# Patient Record
Sex: Female | Born: 1945 | Hispanic: Yes | Marital: Married | Smoking: Never smoker
Health system: Southern US, Community
[De-identification: ages and names within clinical notes are randomized; demographics above are authoritative.]

## PROBLEM LIST (undated history)

## (undated) DIAGNOSIS — I10 Essential (primary) hypertension: Secondary | ICD-10-CM

## (undated) DIAGNOSIS — R55 Syncope and collapse: Secondary | ICD-10-CM

## (undated) DIAGNOSIS — R42 Dizziness and giddiness: Secondary | ICD-10-CM

## (undated) DIAGNOSIS — R609 Edema, unspecified: Secondary | ICD-10-CM

## (undated) HISTORY — PX: EYE SURGERY: SHX253

---

## 2004-09-23 ENCOUNTER — Ambulatory Visit: Payer: Self-pay

## 2004-10-01 ENCOUNTER — Ambulatory Visit: Payer: Self-pay

## 2005-04-06 ENCOUNTER — Ambulatory Visit: Payer: Self-pay

## 2006-07-18 ENCOUNTER — Ambulatory Visit: Payer: Self-pay

## 2006-10-11 ENCOUNTER — Ambulatory Visit: Payer: Self-pay

## 2006-10-23 ENCOUNTER — Ambulatory Visit: Payer: Self-pay

## 2007-11-28 ENCOUNTER — Ambulatory Visit: Payer: Self-pay

## 2014-06-06 ENCOUNTER — Other Ambulatory Visit: Payer: Self-pay | Admitting: Primary Care

## 2014-06-06 DIAGNOSIS — Z1231 Encounter for screening mammogram for malignant neoplasm of breast: Secondary | ICD-10-CM

## 2014-06-18 ENCOUNTER — Other Ambulatory Visit: Payer: Self-pay | Admitting: Primary Care

## 2014-06-18 ENCOUNTER — Ambulatory Visit
Admission: RE | Admit: 2014-06-18 | Discharge: 2014-06-18 | Disposition: A | Discharge status: Home / Self Care | Payer: Medicare Other | Source: Ambulatory Visit | Attending: Primary Care | Admitting: Primary Care

## 2014-06-18 DIAGNOSIS — Z1231 Encounter for screening mammogram for malignant neoplasm of breast: Secondary | ICD-10-CM | POA: Diagnosis present

## 2014-07-16 ENCOUNTER — Inpatient Hospital Stay: Admission: RE | Admit: 2014-07-16 | Payer: Medicare Other | Source: Ambulatory Visit

## 2014-07-17 ENCOUNTER — Encounter
Admission: RE | Admit: 2014-07-17 | Discharge: 2014-07-17 | Disposition: A | Discharge status: Home / Self Care | Payer: Medicare Other | Source: Ambulatory Visit | Attending: Ophthalmology | Admitting: Ophthalmology

## 2014-07-17 DIAGNOSIS — R2243 Localized swelling, mass and lump, lower limb, bilateral: Secondary | ICD-10-CM | POA: Insufficient documentation

## 2014-07-17 DIAGNOSIS — Z01810 Encounter for preprocedural cardiovascular examination: Secondary | ICD-10-CM | POA: Diagnosis present

## 2014-07-17 DIAGNOSIS — Z01812 Encounter for preprocedural laboratory examination: Secondary | ICD-10-CM | POA: Diagnosis present

## 2014-07-17 DIAGNOSIS — R55 Syncope and collapse: Secondary | ICD-10-CM | POA: Diagnosis not present

## 2014-07-17 DIAGNOSIS — I10 Essential (primary) hypertension: Secondary | ICD-10-CM | POA: Insufficient documentation

## 2014-07-17 DIAGNOSIS — E780 Pure hypercholesterolemia: Secondary | ICD-10-CM | POA: Insufficient documentation

## 2014-07-17 DIAGNOSIS — H2511 Age-related nuclear cataract, right eye: Secondary | ICD-10-CM | POA: Insufficient documentation

## 2014-07-17 DIAGNOSIS — R42 Dizziness and giddiness: Secondary | ICD-10-CM | POA: Insufficient documentation

## 2014-07-17 LAB — POTASSIUM: Potassium: 4.9 mmol/L (ref 3.5–5.1)

## 2014-07-18 ENCOUNTER — Encounter: Payer: Self-pay | Admitting: *Deleted

## 2014-07-18 DIAGNOSIS — R42 Dizziness and giddiness: Secondary | ICD-10-CM | POA: Diagnosis not present

## 2014-07-18 DIAGNOSIS — H2511 Age-related nuclear cataract, right eye: Secondary | ICD-10-CM | POA: Diagnosis present

## 2014-07-18 DIAGNOSIS — I10 Essential (primary) hypertension: Secondary | ICD-10-CM | POA: Diagnosis not present

## 2014-07-18 DIAGNOSIS — E669 Obesity, unspecified: Secondary | ICD-10-CM | POA: Diagnosis not present

## 2014-07-18 DIAGNOSIS — Z79899 Other long term (current) drug therapy: Secondary | ICD-10-CM | POA: Diagnosis not present

## 2014-07-18 DIAGNOSIS — M7989 Other specified soft tissue disorders: Secondary | ICD-10-CM | POA: Diagnosis not present

## 2014-07-18 DIAGNOSIS — E780 Pure hypercholesterolemia: Secondary | ICD-10-CM | POA: Diagnosis not present

## 2014-07-18 DIAGNOSIS — R55 Syncope and collapse: Secondary | ICD-10-CM | POA: Diagnosis not present

## 2014-07-18 DIAGNOSIS — Z87891 Personal history of nicotine dependence: Secondary | ICD-10-CM | POA: Diagnosis not present

## 2014-07-22 ENCOUNTER — Encounter
Admission: RE | Disposition: A | Discharge status: Home / Self Care | Payer: Self-pay | Source: Ambulatory Visit | Attending: Ophthalmology

## 2014-07-22 ENCOUNTER — Ambulatory Visit: Payer: Medicare Other | Admitting: Anesthesiology

## 2014-07-22 ENCOUNTER — Ambulatory Visit
Admission: RE | Admit: 2014-07-22 | Discharge: 2014-07-22 | Disposition: A | Discharge status: Home / Self Care | Payer: Medicare Other | Source: Ambulatory Visit | Attending: Ophthalmology | Admitting: Ophthalmology

## 2014-07-22 ENCOUNTER — Encounter: Payer: Self-pay | Admitting: Ophthalmology

## 2014-07-22 DIAGNOSIS — Z79899 Other long term (current) drug therapy: Secondary | ICD-10-CM | POA: Insufficient documentation

## 2014-07-22 DIAGNOSIS — H2511 Age-related nuclear cataract, right eye: Secondary | ICD-10-CM | POA: Insufficient documentation

## 2014-07-22 DIAGNOSIS — I10 Essential (primary) hypertension: Secondary | ICD-10-CM | POA: Insufficient documentation

## 2014-07-22 DIAGNOSIS — E669 Obesity, unspecified: Secondary | ICD-10-CM | POA: Insufficient documentation

## 2014-07-22 DIAGNOSIS — E780 Pure hypercholesterolemia: Secondary | ICD-10-CM | POA: Insufficient documentation

## 2014-07-22 DIAGNOSIS — R55 Syncope and collapse: Secondary | ICD-10-CM | POA: Insufficient documentation

## 2014-07-22 DIAGNOSIS — R42 Dizziness and giddiness: Secondary | ICD-10-CM | POA: Insufficient documentation

## 2014-07-22 DIAGNOSIS — Z87891 Personal history of nicotine dependence: Secondary | ICD-10-CM | POA: Insufficient documentation

## 2014-07-22 DIAGNOSIS — M7989 Other specified soft tissue disorders: Secondary | ICD-10-CM | POA: Insufficient documentation

## 2014-07-22 HISTORY — DX: Essential (primary) hypertension: I10

## 2014-07-22 HISTORY — DX: Syncope and collapse: R55

## 2014-07-22 HISTORY — DX: Edema, unspecified: R60.9

## 2014-07-22 HISTORY — PX: CATARACT EXTRACTION W/PHACO: SHX586

## 2014-07-22 HISTORY — DX: Dizziness and giddiness: R42

## 2014-07-22 SURGERY — PHACOEMULSIFICATION, CATARACT, WITH IOL INSERTION
Anesthesia: Monitor Anesthesia Care | Site: Eye | Laterality: Right | Wound class: Clean

## 2014-07-22 MED ORDER — MOXIFLOXACIN HCL 0.5 % OP SOLN
OPHTHALMIC | Status: DC | PRN
  Administered 2014-07-22: 1 [drp] via OPHTHALMIC

## 2014-07-22 MED ORDER — POVIDONE-IODINE 5 % OP SOLN
OPHTHALMIC | Status: AC
  Administered 2014-07-22: 1 via OPHTHALMIC
  Filled 2014-07-22: qty 30

## 2014-07-22 MED ORDER — CEFUROXIME OPHTHALMIC INJECTION 1 MG/0.1 ML
INJECTION | OPHTHALMIC | Status: DC | PRN
  Administered 2014-07-22: 0.1 mL via INTRACAMERAL

## 2014-07-22 MED ORDER — TETRACAINE HCL 0.5 % OP SOLN
OPHTHALMIC | Status: AC
  Administered 2014-07-22: 1 [drp] via OPHTHALMIC
  Filled 2014-07-22: qty 2

## 2014-07-22 MED ORDER — MOXIFLOXACIN HCL 0.5 % OP SOLN
OPHTHALMIC | Status: AC
  Filled 2014-07-22: qty 3

## 2014-07-22 MED ORDER — ARMC OPHTHALMIC DILATING GEL
OPHTHALMIC | Status: AC
  Administered 2014-07-22: 1 via OPHTHALMIC
  Filled 2014-07-22: qty 0.25

## 2014-07-22 MED ORDER — ARMC OPHTHALMIC DILATING GEL
1.0000 "application " | OPHTHALMIC | Status: DC | PRN
  Administered 2014-07-22: 1 via OPHTHALMIC

## 2014-07-22 MED ORDER — TETRACAINE HCL 0.5 % OP SOLN
1.0000 [drp] | OPHTHALMIC | Status: AC | PRN
  Administered 2014-07-22: 1 [drp] via OPHTHALMIC

## 2014-07-22 MED ORDER — BSS IO SOLN
INTRAOCULAR | Status: DC | PRN
  Administered 2014-07-22: 200 mL

## 2014-07-22 MED ORDER — POVIDONE-IODINE 5 % OP SOLN
1.0000 "application " | OPHTHALMIC | Status: AC | PRN
  Administered 2014-07-22: 1 via OPHTHALMIC

## 2014-07-22 MED ORDER — NA CHONDROIT SULF-NA HYALURON 40-17 MG/ML IO SOLN
INTRAOCULAR | Status: AC
  Filled 2014-07-22: qty 1

## 2014-07-22 MED ORDER — MOXIFLOXACIN HCL 0.5 % OP SOLN: 1.0000 [drp] | Freq: Once | OPHTHALMIC | Status: DC

## 2014-07-22 MED ORDER — EPINEPHRINE HCL 1 MG/ML IJ SOLN
INTRAMUSCULAR | Status: AC
  Filled 2014-07-22: qty 1

## 2014-07-22 MED ORDER — CARBACHOL 0.01 % IO SOLN
INTRAOCULAR | Status: DC | PRN
  Administered 2014-07-22: 0.5 mL via INTRAOCULAR

## 2014-07-22 MED ORDER — SODIUM CHLORIDE 0.9 % IV SOLN
INTRAVENOUS | Status: DC
Start: 2014-07-22 — End: 2014-07-22
  Administered 2014-07-22: 11:00:00 via INTRAVENOUS

## 2014-07-22 MED ORDER — CEFUROXIME OPHTHALMIC INJECTION 1 MG/0.1 ML
INJECTION | OPHTHALMIC | Status: AC
  Filled 2014-07-22: qty 0.1

## 2014-07-22 MED ORDER — MIDAZOLAM HCL 2 MG/2ML IJ SOLN
INTRAMUSCULAR | Status: DC | PRN
  Administered 2014-07-22 (×2): 1 mg via INTRAVENOUS

## 2014-07-22 SURGICAL SUPPLY — 22 items
CANNULA ANT/CHMB 27GA (MISCELLANEOUS) ×2 IMPLANT
GLOVE BIO SURGEON STRL SZ8 (GLOVE) ×2 IMPLANT
GLOVE BIOGEL M 6.5 STRL (GLOVE) ×2 IMPLANT
GLOVE SURG LX 8.0 MICRO (GLOVE) ×1
GLOVE SURG LX STRL 8.0 MICRO (GLOVE) ×1 IMPLANT
GOWN STRL REUS W/ TWL LRG LVL3 (GOWN DISPOSABLE) ×2 IMPLANT
GOWN STRL REUS W/TWL LRG LVL3 (GOWN DISPOSABLE) ×2
LENS IOL TECNIS 24.0 (Intraocular Lens) ×2 IMPLANT
LENS IOL TECNIS MONO 1P 24.0 (Intraocular Lens) ×1 IMPLANT
PACK CATARACT (MISCELLANEOUS) ×2 IMPLANT
PACK CATARACT BRASINGTON LX (MISCELLANEOUS) ×2 IMPLANT
PACK EYE AFTER SURG (MISCELLANEOUS) ×2 IMPLANT
PACK VITRECTOMY CASSETTE 23GA (MISCELLANEOUS) IMPLANT
PACK VITRECTOMY CASSETTE 25GA (MISCELLANEOUS) IMPLANT
SOL BSS BAG (MISCELLANEOUS) ×2
SOL PREP PVP 2OZ (MISCELLANEOUS) ×2
SOLUTION BSS BAG (MISCELLANEOUS) ×1 IMPLANT
SOLUTION PREP PVP 2OZ (MISCELLANEOUS) ×1 IMPLANT
SYR 5ML LL (SYRINGE) ×2 IMPLANT
SYR TB 1ML 27GX1/2 LL (SYRINGE) ×2 IMPLANT
WATER STERILE IRR 1000ML POUR (IV SOLUTION) ×2 IMPLANT
WIPE NON LINTING 3.25X3.25 (MISCELLANEOUS) ×2 IMPLANT

## 2014-07-22 NOTE — Transfer of Care (Signed)
Immediate Anesthesia Transfer of Care Note  Patient: Ana Peterson  Procedure(s) Performed: Procedure(s) with comments: CATARACT EXTRACTION PHACO AND INTRAOCULAR LENS PLACEMENT (IOC) (Right) - US   1:02.7 AP%  25.3 CDE  15.88 fluid pack lot # [id number]  Patient Location: PACU  Anesthesia Type:MAC  Level of Consciousness: awake, alert  and oriented  Airway & Oxygen Therapy: Patient Spontanous Breathing  Post-op Assessment: Report given to RN and Post -op Vital signs reviewed and stable  Post vital signs: stable  Last Vitals:  Filed Vitals:   07/22/14 1224  BP: 139/71  Pulse: 74  Temp: 36.4 C  Resp: 12    Complications: No apparent anesthesia complications

## 2014-07-22 NOTE — Op Note (Signed)
PREOPERATIVE DIAGNOSIS:  Nuclear sclerotic cataract of the right eye.   POSTOPERATIVE DIAGNOSIS: nuclear sclerotic cataract right eye   OPERATIVE PROCEDURE:  Procedure(s): CATARACT EXTRACTION PHACO AND INTRAOCULAR LENS PLACEMENT (IOC)   SURGEON:  Galen ManilaWilliam Susen Haskew, MD.   ANESTHESIA:  Anesthesiologist: Yevette EdwardsJames G Adams, MD CRNA: Irving BurtonJennifer Bachich, CRNA  1.      Managed anesthesia care. 2.      Topical tetracaine drops followed by 2% Xylocaine jelly applied in the preoperative holding area.   COMPLICATIONS:  None.   TECHNIQUE:   Stop and chop   DESCRIPTION OF PROCEDURE:  The patient was examined and consented in the preoperative holding area where the aforementioned topical anesthesia was applied to the right eye and then brought back to the Operating Room where the right eye was prepped and draped in the usual sterile ophthalmic fashion and a lid speculum was placed. A paracentesis was created with the side port blade and the anterior chamber was filled with viscoelastic. A near clear corneal incision was performed with the steel keratome. A continuous curvilinear capsulorrhexis was performed with a cystotome followed by the capsulorrhexis forceps. Hydrodissection and hydrodelineation were carried out with BSS on a blunt cannula. The lens was removed in a stop and chop  technique and the remaining cortical material was removed with the irrigation-aspiration handpiece. The capsular bag was inflated with viscoelastic and the Technis ZCB00  lens was placed in the capsular bag without complication. The remaining viscoelastic was removed from the eye with the irrigation-aspiration handpiece. The wounds were hydrated. The anterior chamber was flushed with Miostat and the eye was inflated to physiologic pressure. 0.1 mL of cefuroxime concentration 10 mg/mL was placed in the anterior chamber. The wounds were found to be water tight. The eye was dressed with Vigamox. The patient was given protective glasses to  wear throughout the day and a shield with which to sleep tonight. The patient was also given drops with which to begin a drop regimen today and will follow-up with me in one day.  Implant Name Type Inv. Item Serial No. Manufacturer Lot No. LRB No. Used  LENS IMPL INTRAOC ZCB00 24.0 - [id number] Intraocular Lens LENS IMPL INTRAOC ZCB00 24.0 [id number] AMO   Right 1   Procedure(s) with comments: CATARACT EXTRACTION PHACO AND INTRAOCULAR LENS PLACEMENT (IOC) (Right) - US   1:02.7 AP%  25.3 CDE  15.88 fluid pack lot # [id number]  Electronically signed: Evelynn Hench LOUIS 07/22/2014 12:23 PM

## 2014-07-22 NOTE — Anesthesia Postprocedure Evaluation (Signed)
  Anesthesia Post-op Note  Patient: Ana Peterson  Procedure(s) Performed: Procedure(s) with comments: CATARACT EXTRACTION PHACO AND INTRAOCULAR LENS PLACEMENT (IOC) (Right) - US   1:02.7 AP%  25.3 CDE  15.88 fluid pack lot # [id number]  Anesthesia type:MAC  Patient location: PACU  Post pain: Pain level controlled  Post assessment: Post-op Vital signs reviewed, Patient's Cardiovascular Status Stable, Respiratory Function Stable, Patent Airway and No signs of Nausea or vomiting  Post vital signs: Reviewed and stable  Last Vitals:  Filed Vitals:   07/22/14 1224  BP: 139/71  Pulse: 74  Temp: 36.4 C  Resp: 12    Level of consciousness: awake, alert  and patient cooperative  Complications: No apparent anesthesia complications

## 2014-07-22 NOTE — H&P (Signed)
  All labs reviewed. Abnormal studies sent to patients PCP when indicated.  Previous H&P reviewed, patient examined, there are NO CHANGES.  Ana Peterson LOUIS6/28/201611:56 AM

## 2014-07-22 NOTE — Discharge Instructions (Addendum)
See cataract post op handout  Eye Surgery Discharge Instructions  Expect mild scratchy sensation or mild soreness. DO NOT RUB YOUR EYE!  The day of surgery:  Minimal physical activity, but bed rest is not required  No reading, computer work, or close hand work  No bending, lifting, or straining.  May watch TV  For 24 hours:  No driving, legal decisions, or alcoholic beverages  Safety precautions  Eat anything you prefer: It is better to start with liquids, then soup then solid foods.  _____ Eye patch should be worn until postoperative exam tomorrow.  ____ Solar shield eyeglasses should be worn for comfort in the sunlight/patch while sleeping  Resume all regular medications including aspirin or Coumadin if these were discontinued prior to surgery. You may shower, bathe, shave, or wash your hair. Tylenol may be taken for mild discomfort.  Call your doctor if you experience significant pain, nausea, or vomiting, fever > 101 or other signs of infection. [phone] or [phone] Specific instructions:  Follow-up Information    Follow up with Carlena BjornstadPORFILIO,WILLIAM LOUIS, MD On 07/23/2014.   Specialty:  Ophthalmology   Why:  9:35   Contact information:   [address] [place] [address] [phone]      Eye Surgery Discharge Instructions  Expect mild scratchy sensation or mild soreness. DO NOT RUB YOUR EYE!  The day of surgery:  Minimal physical activity, but bed rest is not required  No reading, computer work, or close hand work  No bending, lifting, or straining.  May watch TV  For 24 hours:  No driving, legal decisions, or alcoholic beverages  Safety precautions  Eat anything you prefer: It is better to start with liquids, then soup then solid foods.  _____ Eye patch should be worn until postoperative exam tomorrow.  ____ Solar shield eyeglasses should be worn for comfort in the sunlight/patch while sleeping  Resume all regular medications  including aspirin or Coumadin if these were discontinued prior to surgery. You may shower, bathe, shave, or wash your hair. Tylenol may be taken for mild discomfort.  Call your doctor if you experience significant pain, nausea, or vomiting, fever > 101 or other signs of infection. [phone] or [phone] Specific instructions:  Follow-up Information    Follow up with Carlena BjornstadPORFILIO,WILLIAM LOUIS, MD On 07/23/2014.   Specialty:  Ophthalmology   Why:  9:35   Contact information:   [address] [place] [address] [phone]

## 2014-07-22 NOTE — Anesthesia Preprocedure Evaluation (Addendum)
Anesthesia Evaluation  Patient identified by MRN, date of birth, ID band Patient awake    Reviewed: Allergy & Precautions, H&P , NPO status , Patient's Chart, lab work & pertinent test results, reviewed documented beta blocker date and time   Airway Mallampati: II  TM Distance: >3 FB Neck ROM: full    Dental no notable dental hx. (+) Teeth Intact   Pulmonary neg pulmonary ROS,  breath sounds clear to auscultation  Pulmonary exam normal       Cardiovascular Exercise Tolerance: Good hypertension, negative cardio ROS  Rhythm:regular Rate:Normal     Neuro/Psych negative neurological ROS  negative psych ROS   GI/Hepatic negative GI ROS, Neg liver ROS,   Endo/Other  negative endocrine ROS  Renal/GU      Musculoskeletal   Abdominal   Peds  Hematology negative hematology ROS (+)   Anesthesia Other Findings   Reproductive/Obstetrics negative OB ROS                             Anesthesia Physical Anesthesia Plan  ASA: II  Anesthesia Plan: MAC   Post-op Pain Management:    Induction:   Airway Management Planned:   Additional Equipment:   Intra-op Plan:   Post-operative Plan:   Informed Consent: I have reviewed the patients History and Physical, chart, labs and discussed the procedure including the risks, benefits and alternatives for the proposed anesthesia with the patient or authorized representative who has indicated his/her understanding and acceptance.     Plan Discussed with: CRNA  Anesthesia Plan Comments:         Anesthesia Quick Evaluation

## 2015-01-22 ENCOUNTER — Other Ambulatory Visit: Payer: Self-pay | Admitting: Primary Care

## 2015-01-22 DIAGNOSIS — Z0001 Encounter for general adult medical examination with abnormal findings: Secondary | ICD-10-CM

## 2015-02-10 ENCOUNTER — Ambulatory Visit
Admission: RE | Admit: 2015-02-10 | Discharge: 2015-02-10 | Disposition: A | Discharge status: Home / Self Care | Payer: Medicare Other | Source: Ambulatory Visit | Attending: Primary Care | Admitting: Primary Care

## 2015-02-10 DIAGNOSIS — Z13820 Encounter for screening for osteoporosis: Secondary | ICD-10-CM | POA: Insufficient documentation

## 2015-02-10 DIAGNOSIS — M8580 Other specified disorders of bone density and structure, unspecified site: Secondary | ICD-10-CM | POA: Diagnosis not present

## 2015-02-10 DIAGNOSIS — Z0001 Encounter for general adult medical examination with abnormal findings: Secondary | ICD-10-CM

## 2015-07-08 ENCOUNTER — Other Ambulatory Visit: Payer: Self-pay | Admitting: Primary Care

## 2015-07-09 ENCOUNTER — Other Ambulatory Visit: Payer: Self-pay | Admitting: Primary Care

## 2015-07-09 DIAGNOSIS — Z1231 Encounter for screening mammogram for malignant neoplasm of breast: Secondary | ICD-10-CM

## 2015-07-30 ENCOUNTER — Other Ambulatory Visit: Payer: Self-pay | Admitting: Primary Care

## 2015-07-30 ENCOUNTER — Ambulatory Visit
Admission: RE | Admit: 2015-07-30 | Discharge: 2015-07-30 | Disposition: A | Discharge status: Home / Self Care | Payer: Medicare Other | Source: Ambulatory Visit | Attending: Primary Care | Admitting: Primary Care

## 2015-07-30 DIAGNOSIS — Z1231 Encounter for screening mammogram for malignant neoplasm of breast: Secondary | ICD-10-CM | POA: Insufficient documentation

## 2016-07-19 ENCOUNTER — Other Ambulatory Visit: Payer: Self-pay | Admitting: Primary Care

## 2016-07-19 DIAGNOSIS — Z1231 Encounter for screening mammogram for malignant neoplasm of breast: Secondary | ICD-10-CM

## 2016-08-23 ENCOUNTER — Ambulatory Visit
Admission: RE | Admit: 2016-08-23 | Discharge: 2016-08-23 | Disposition: A | Discharge status: Home / Self Care | Payer: Medicare Other | Source: Ambulatory Visit | Attending: Primary Care | Admitting: Primary Care

## 2016-08-23 DIAGNOSIS — Z1231 Encounter for screening mammogram for malignant neoplasm of breast: Secondary | ICD-10-CM | POA: Insufficient documentation

## 2017-08-28 ENCOUNTER — Other Ambulatory Visit: Payer: Self-pay | Admitting: Primary Care

## 2017-08-28 DIAGNOSIS — Z1231 Encounter for screening mammogram for malignant neoplasm of breast: Secondary | ICD-10-CM

## 2017-09-14 ENCOUNTER — Ambulatory Visit
Admission: RE | Admit: 2017-09-14 | Discharge: 2017-09-14 | Disposition: A | Discharge status: Home / Self Care | Payer: Medicare Other | Source: Ambulatory Visit | Attending: Primary Care | Admitting: Primary Care

## 2017-09-14 DIAGNOSIS — Z1231 Encounter for screening mammogram for malignant neoplasm of breast: Secondary | ICD-10-CM | POA: Diagnosis present

## 2017-09-19 ENCOUNTER — Other Ambulatory Visit: Payer: Self-pay | Admitting: Primary Care

## 2017-09-19 DIAGNOSIS — N6489 Other specified disorders of breast: Secondary | ICD-10-CM

## 2017-09-19 DIAGNOSIS — R928 Other abnormal and inconclusive findings on diagnostic imaging of breast: Secondary | ICD-10-CM

## 2017-10-09 ENCOUNTER — Ambulatory Visit
Admission: RE | Admit: 2017-10-09 | Discharge: 2017-10-09 | Disposition: A | Discharge status: Home / Self Care | Payer: Medicare Other | Source: Ambulatory Visit | Attending: Primary Care | Admitting: Primary Care

## 2017-10-09 DIAGNOSIS — N6489 Other specified disorders of breast: Secondary | ICD-10-CM | POA: Diagnosis present

## 2017-10-09 DIAGNOSIS — R928 Other abnormal and inconclusive findings on diagnostic imaging of breast: Secondary | ICD-10-CM

## 2018-01-26 ENCOUNTER — Other Ambulatory Visit: Payer: Self-pay | Admitting: Primary Care

## 2018-01-26 DIAGNOSIS — R1030 Lower abdominal pain, unspecified: Secondary | ICD-10-CM

## 2018-02-01 ENCOUNTER — Ambulatory Visit: Payer: Medicare Other

## 2018-08-23 ENCOUNTER — Other Ambulatory Visit: Payer: Self-pay | Admitting: Primary Care

## 2018-08-23 DIAGNOSIS — Z1231 Encounter for screening mammogram for malignant neoplasm of breast: Secondary | ICD-10-CM

## 2018-08-23 DIAGNOSIS — Z0000 Encounter for general adult medical examination without abnormal findings: Secondary | ICD-10-CM

## 2018-10-10 ENCOUNTER — Ambulatory Visit
Admission: RE | Admit: 2018-10-10 | Discharge: 2018-10-10 | Disposition: A | Discharge status: Home / Self Care | Payer: Medicare Other | Source: Ambulatory Visit | Attending: Primary Care | Admitting: Primary Care

## 2018-10-10 DIAGNOSIS — Z13820 Encounter for screening for osteoporosis: Secondary | ICD-10-CM | POA: Diagnosis not present

## 2018-10-10 DIAGNOSIS — M8580 Other specified disorders of bone density and structure, unspecified site: Secondary | ICD-10-CM | POA: Diagnosis not present

## 2018-10-10 DIAGNOSIS — Z780 Asymptomatic menopausal state: Secondary | ICD-10-CM | POA: Diagnosis not present

## 2018-10-10 DIAGNOSIS — Z1231 Encounter for screening mammogram for malignant neoplasm of breast: Secondary | ICD-10-CM | POA: Insufficient documentation

## 2018-10-10 DIAGNOSIS — Z0000 Encounter for general adult medical examination without abnormal findings: Secondary | ICD-10-CM

## 2018-11-13 ENCOUNTER — Ambulatory Visit: Admission: RE | Admit: 2018-11-13 | Payer: Medicare Other | Source: Ambulatory Visit

## 2018-11-21 ENCOUNTER — Other Ambulatory Visit: Payer: Self-pay | Admitting: Physician Assistant

## 2018-11-21 DIAGNOSIS — R102 Pelvic and perineal pain: Secondary | ICD-10-CM

## 2019-06-17 ENCOUNTER — Ambulatory Visit: Admission: RE | Admit: 2019-06-17 | Payer: Medicare Other | Source: Ambulatory Visit

## 2019-06-19 ENCOUNTER — Other Ambulatory Visit: Payer: Self-pay

## 2019-06-19 ENCOUNTER — Ambulatory Visit
Admission: RE | Admit: 2019-06-19 | Discharge: 2019-06-19 | Disposition: A | Discharge status: Home / Self Care | Payer: Medicare Other | Source: Ambulatory Visit | Attending: Physician Assistant | Admitting: Physician Assistant

## 2019-06-19 DIAGNOSIS — R102 Pelvic and perineal pain: Secondary | ICD-10-CM | POA: Diagnosis present

## 2019-10-30 ENCOUNTER — Other Ambulatory Visit: Payer: Self-pay | Admitting: Primary Care

## 2019-10-30 DIAGNOSIS — Z1231 Encounter for screening mammogram for malignant neoplasm of breast: Secondary | ICD-10-CM

## 2019-12-26 ENCOUNTER — Other Ambulatory Visit: Payer: Self-pay

## 2019-12-26 ENCOUNTER — Ambulatory Visit
Admission: RE | Admit: 2019-12-26 | Discharge: 2019-12-26 | Disposition: A | Discharge status: Home / Self Care | Payer: Medicare Other | Source: Ambulatory Visit | Attending: Primary Care | Admitting: Primary Care

## 2019-12-26 DIAGNOSIS — Z1231 Encounter for screening mammogram for malignant neoplasm of breast: Secondary | ICD-10-CM | POA: Diagnosis present

## 2020-11-20 ENCOUNTER — Other Ambulatory Visit: Payer: Self-pay | Admitting: Primary Care

## 2020-11-20 DIAGNOSIS — Z1231 Encounter for screening mammogram for malignant neoplasm of breast: Secondary | ICD-10-CM

## 2021-03-01 IMAGING — MG DIGITAL SCREENING BILAT W/ TOMO W/ CAD
8 series · 8 of 24 positions shown · non-contrast
Comparison: Previous exam(s).

CLINICAL DATA: Screening.

EXAM:
DIGITAL SCREENING BILATERAL MAMMOGRAM WITH TOMO AND CAD

[L CC synth-2D]
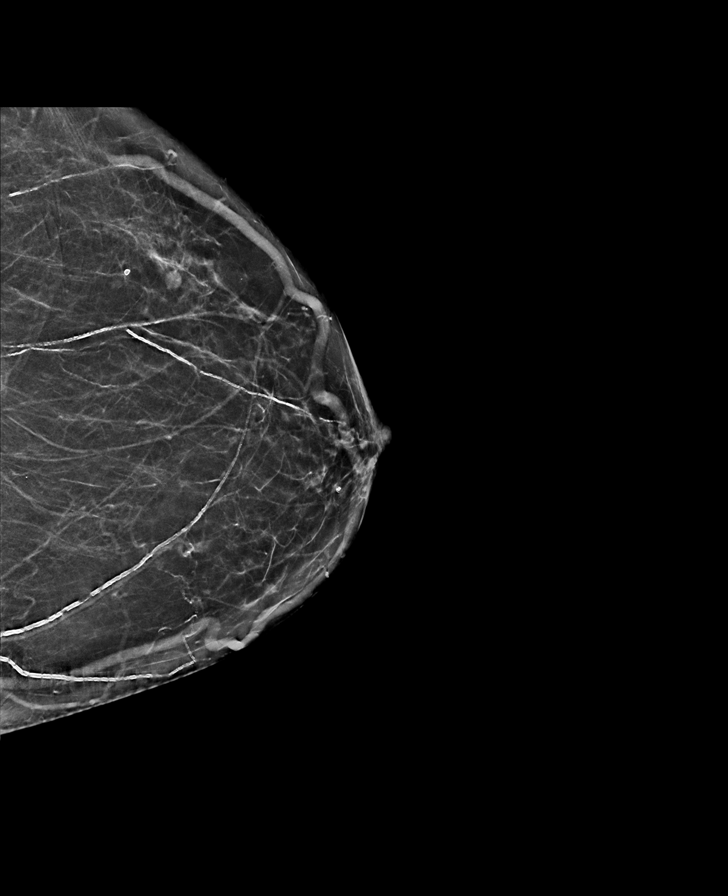

[R CC synth-2D]
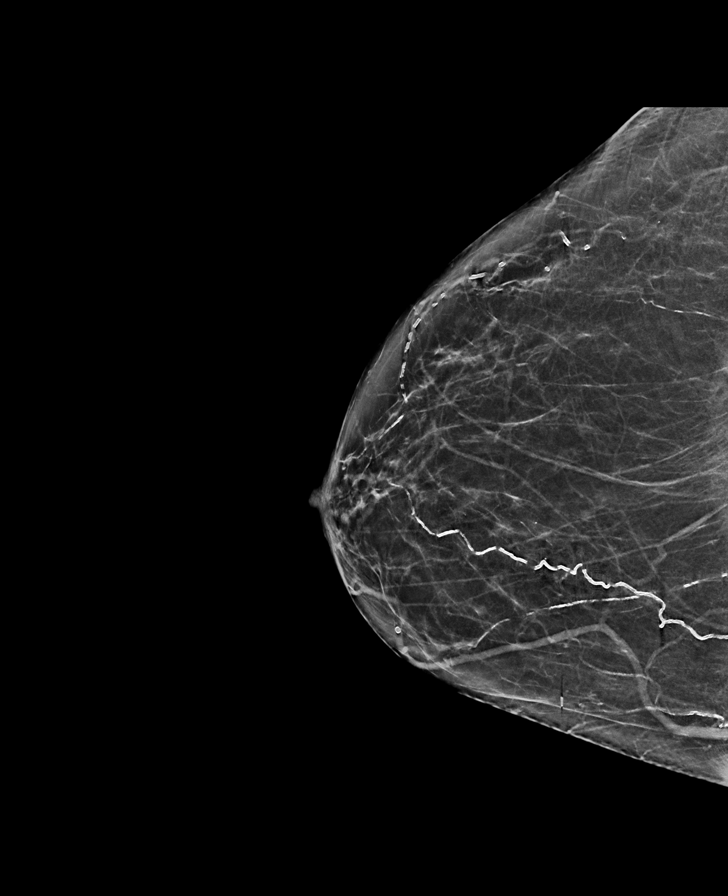

[L MLO synth-2D]
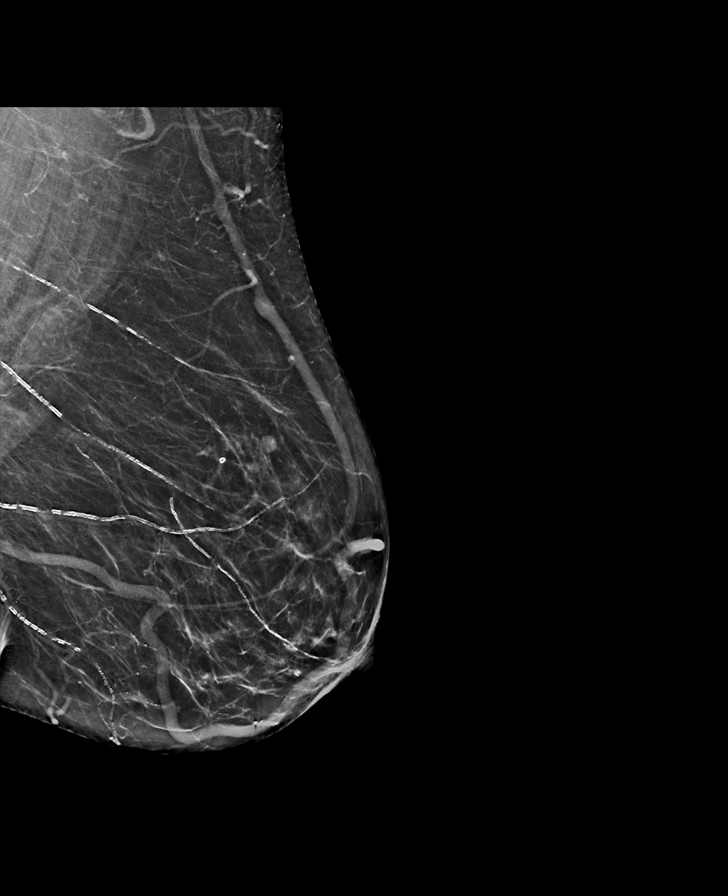

[R MLO synth-2D]
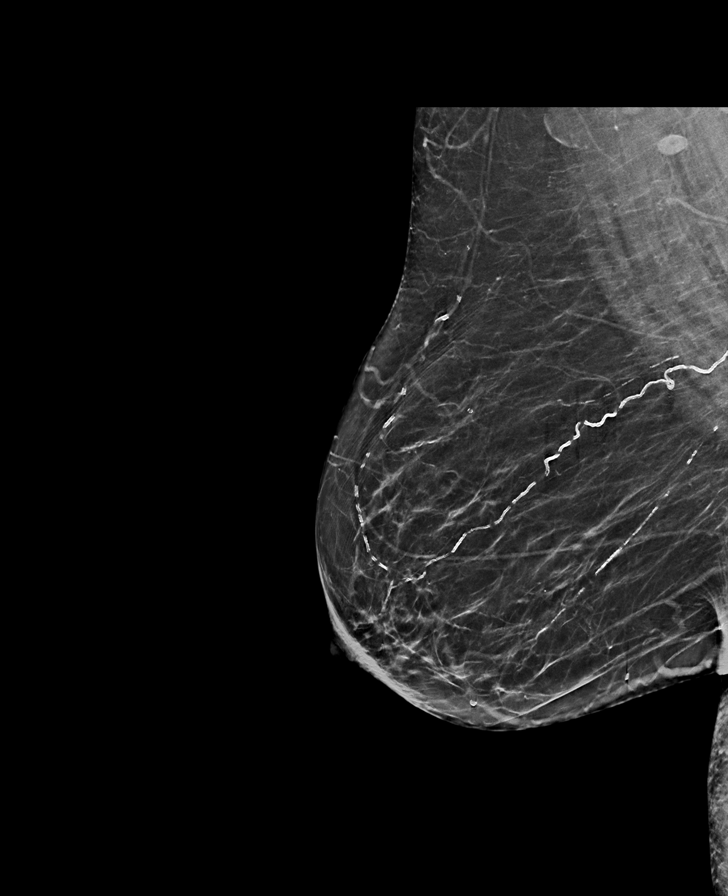

[L MLO tomo · tomo slice 33/64.0]
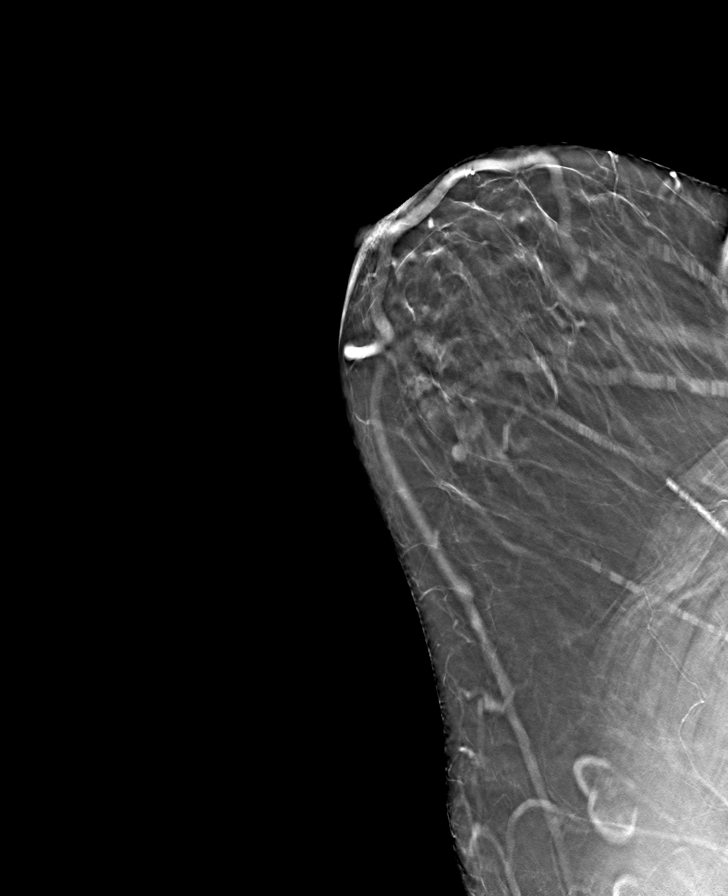

[R MLO tomo · tomo slice 33/64.0]
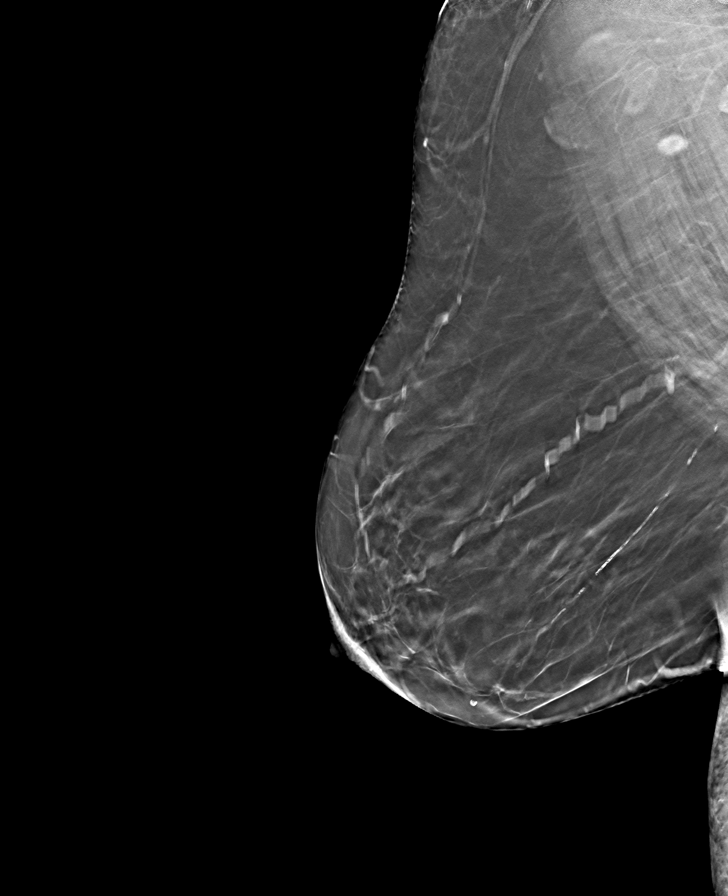

[L CC tomo · tomo slice 30/59.0]
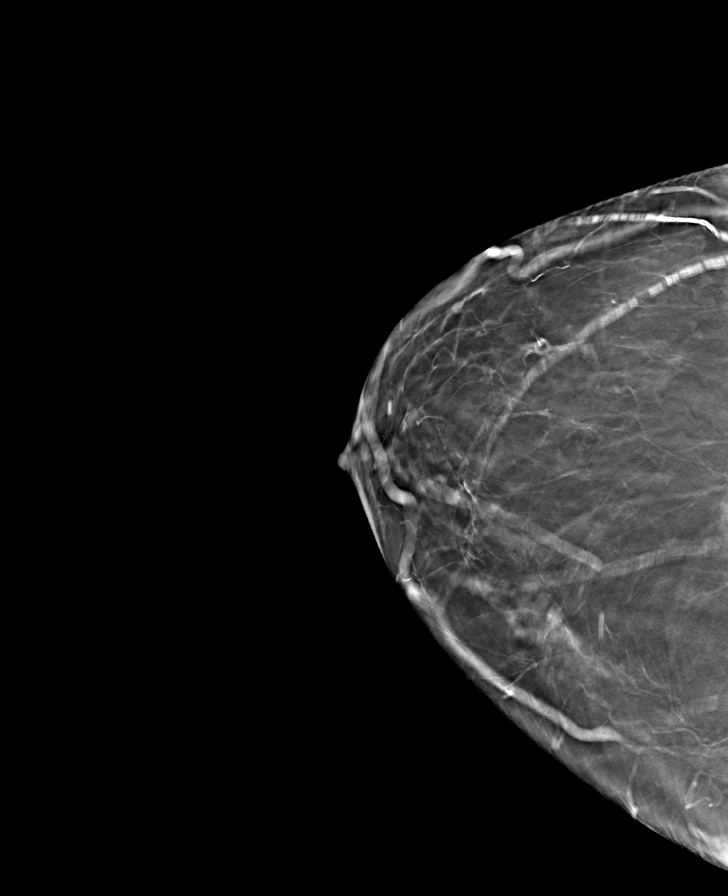

[R CC tomo · tomo slice 29/58.0]
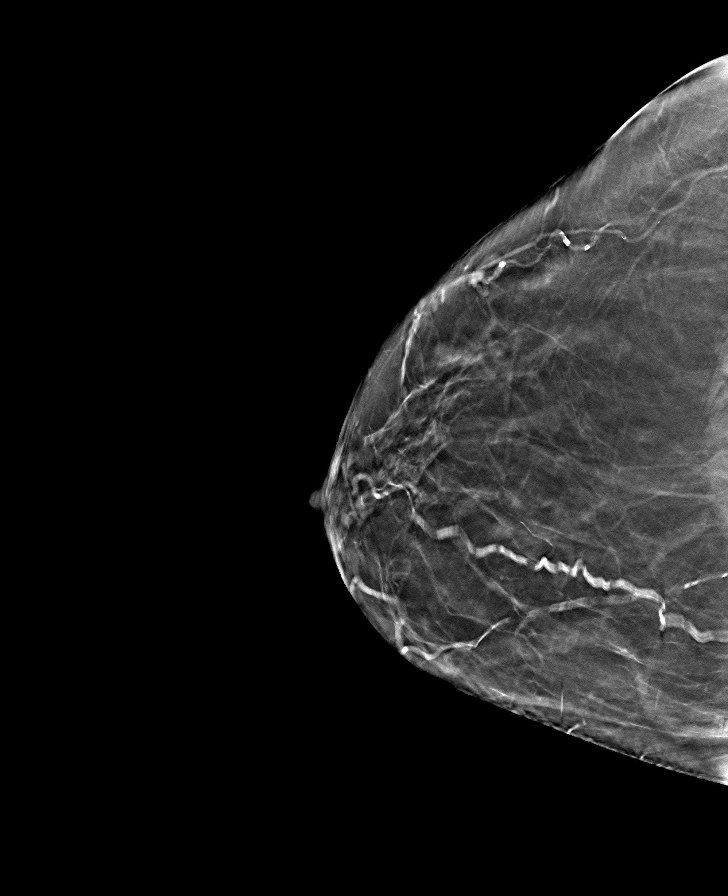

[8 of 24 positions shown; findings below may reference images not displayed]

ACR Breast Density Category b: There are scattered areas of
fibroglandular density.
FINDINGS: There are no findings suspicious for malignancy. Images were
processed with CAD.
IMPRESSION: No mammographic evidence of malignancy. A result letter of this
screening mammogram will be mailed directly to the patient.

RECOMMENDATION:
Screening mammogram in one year. (Code:[id number])

BI-RADS CATEGORY  1: Negative.

## 2021-09-23 ENCOUNTER — Other Ambulatory Visit: Payer: Self-pay | Admitting: Otolaryngology

## 2021-09-23 DIAGNOSIS — R1310 Dysphagia, unspecified: Secondary | ICD-10-CM

## 2022-01-24 ENCOUNTER — Emergency Department: Payer: Medicare Other

## 2022-01-24 ENCOUNTER — Inpatient Hospital Stay
Admission: EM | Admit: 2022-01-24 | Discharge: 2022-01-28 | DRG: 853 | Disposition: A | Discharge status: Home Health | Payer: Medicare Other | Attending: Student | Admitting: Student

## 2022-01-24 ENCOUNTER — Inpatient Hospital Stay: Payer: Medicare Other

## 2022-01-24 DIAGNOSIS — E872 Acidosis, unspecified: Secondary | ICD-10-CM | POA: Diagnosis present

## 2022-01-24 DIAGNOSIS — A498 Other bacterial infections of unspecified site: Secondary | ICD-10-CM | POA: Diagnosis not present

## 2022-01-24 DIAGNOSIS — Z6827 Body mass index (BMI) 27.0-27.9, adult: Secondary | ICD-10-CM

## 2022-01-24 DIAGNOSIS — R739 Hyperglycemia, unspecified: Secondary | ICD-10-CM | POA: Diagnosis present

## 2022-01-24 DIAGNOSIS — B9620 Unspecified Escherichia coli [E. coli] as the cause of diseases classified elsewhere: Secondary | ICD-10-CM | POA: Diagnosis not present

## 2022-01-24 DIAGNOSIS — Z1612 Extended spectrum beta lactamase (ESBL) resistance: Secondary | ICD-10-CM | POA: Diagnosis present

## 2022-01-24 DIAGNOSIS — K8065 Calculus of gallbladder and bile duct with chronic cholecystitis with obstruction: Secondary | ICD-10-CM | POA: Diagnosis present

## 2022-01-24 DIAGNOSIS — K8050 Calculus of bile duct without cholangitis or cholecystitis without obstruction: Secondary | ICD-10-CM | POA: Diagnosis not present

## 2022-01-24 DIAGNOSIS — K8033 Calculus of bile duct with acute cholangitis with obstruction: Secondary | ICD-10-CM | POA: Diagnosis not present

## 2022-01-24 DIAGNOSIS — K831 Obstruction of bile duct: Secondary | ICD-10-CM | POA: Diagnosis present

## 2022-01-24 DIAGNOSIS — K8071 Calculus of gallbladder and bile duct without cholecystitis with obstruction: Secondary | ICD-10-CM | POA: Diagnosis present

## 2022-01-24 DIAGNOSIS — R7881 Bacteremia: Secondary | ICD-10-CM

## 2022-01-24 DIAGNOSIS — Z9841 Cataract extraction status, right eye: Secondary | ICD-10-CM | POA: Diagnosis not present

## 2022-01-24 DIAGNOSIS — K851 Biliary acute pancreatitis without necrosis or infection: Secondary | ICD-10-CM

## 2022-01-24 DIAGNOSIS — H9190 Unspecified hearing loss, unspecified ear: Secondary | ICD-10-CM | POA: Diagnosis present

## 2022-01-24 DIAGNOSIS — R1013 Epigastric pain: Secondary | ICD-10-CM | POA: Diagnosis present

## 2022-01-24 DIAGNOSIS — R7303 Prediabetes: Secondary | ICD-10-CM | POA: Diagnosis present

## 2022-01-24 DIAGNOSIS — Z79899 Other long term (current) drug therapy: Secondary | ICD-10-CM

## 2022-01-24 DIAGNOSIS — Z603 Acculturation difficulty: Secondary | ICD-10-CM | POA: Diagnosis present

## 2022-01-24 DIAGNOSIS — Z961 Presence of intraocular lens: Secondary | ICD-10-CM | POA: Diagnosis present

## 2022-01-24 DIAGNOSIS — E669 Obesity, unspecified: Secondary | ICD-10-CM | POA: Diagnosis present

## 2022-01-24 DIAGNOSIS — D638 Anemia in other chronic diseases classified elsewhere: Secondary | ICD-10-CM | POA: Diagnosis present

## 2022-01-24 DIAGNOSIS — R8290 Unspecified abnormal findings in urine: Secondary | ICD-10-CM | POA: Diagnosis present

## 2022-01-24 DIAGNOSIS — I959 Hypotension, unspecified: Secondary | ICD-10-CM | POA: Diagnosis present

## 2022-01-24 DIAGNOSIS — R7989 Other specified abnormal findings of blood chemistry: Secondary | ICD-10-CM | POA: Diagnosis present

## 2022-01-24 DIAGNOSIS — K5730 Diverticulosis of large intestine without perforation or abscess without bleeding: Secondary | ICD-10-CM | POA: Diagnosis present

## 2022-01-24 DIAGNOSIS — A4151 Sepsis due to Escherichia coli [E. coli]: Secondary | ICD-10-CM | POA: Diagnosis present

## 2022-01-24 DIAGNOSIS — K8064 Calculus of gallbladder and bile duct with chronic cholecystitis without obstruction: Secondary | ICD-10-CM | POA: Diagnosis not present

## 2022-01-24 DIAGNOSIS — K859 Acute pancreatitis without necrosis or infection, unspecified: Secondary | ICD-10-CM | POA: Diagnosis present

## 2022-01-24 DIAGNOSIS — Z1152 Encounter for screening for COVID-19: Secondary | ICD-10-CM

## 2022-01-24 DIAGNOSIS — I10 Essential (primary) hypertension: Secondary | ICD-10-CM | POA: Diagnosis present

## 2022-01-24 DIAGNOSIS — K8070 Calculus of gallbladder and bile duct without cholecystitis without obstruction: Secondary | ICD-10-CM | POA: Diagnosis not present

## 2022-01-24 DIAGNOSIS — K8309 Other cholangitis: Secondary | ICD-10-CM

## 2022-01-24 LAB — COMPREHENSIVE METABOLIC PANEL
ALT: 239 U/L — ABNORMAL HIGH (ref 0–44)
AST: 624 U/L — ABNORMAL HIGH (ref 15–41)
Albumin: 3.9 g/dL (ref 3.5–5.0)
Alkaline Phosphatase: 80 U/L (ref 38–126)
Anion gap: 13 (ref 5–15)
BUN: 28 mg/dL — ABNORMAL HIGH (ref 8–23)
CO2: 19 mmol/L — ABNORMAL LOW (ref 22–32)
Calcium: 9.2 mg/dL (ref 8.9–10.3)
Chloride: 105 mmol/L (ref 98–111)
Creatinine, Ser: 1.36 mg/dL — ABNORMAL HIGH (ref 0.44–1.00)
GFR, Estimated: 40 mL/min — ABNORMAL LOW (ref >60)
Glucose, Bld: 144 mg/dL — ABNORMAL HIGH (ref 70–99)
Potassium: 4.2 mmol/L (ref 3.5–5.1)
Sodium: 137 mmol/L (ref 135–145)
Total Bilirubin: 1.8 mg/dL — ABNORMAL HIGH (ref 0.3–1.2)
Total Protein: 7.5 g/dL (ref 6.5–8.1)

## 2022-01-24 LAB — URINALYSIS, ROUTINE W REFLEX MICROSCOPIC
Bilirubin Urine: NEGATIVE
Glucose, UA: NEGATIVE mg/dL
Ketones, ur: NEGATIVE mg/dL
Nitrite: NEGATIVE
Protein, ur: 30 mg/dL — AB
Specific Gravity, Urine: 1.021 (ref 1.005–1.030)
pH: 5 (ref 5.0–8.0)

## 2022-01-24 LAB — CBC WITH DIFFERENTIAL/PLATELET
Abs Immature Granulocytes: 0.03 10*3/uL (ref 0.00–0.07)
Basophils Absolute: 0 10*3/uL (ref 0.0–0.1)
Basophils Relative: 0 %
Eosinophils Absolute: 0 10*3/uL (ref 0.0–0.5)
Eosinophils Relative: 0 %
HCT: 35 % — ABNORMAL LOW (ref 36.0–46.0)
Hemoglobin: 11.5 g/dL — ABNORMAL LOW (ref 12.0–15.0)
Immature Granulocytes: 0 %
Lymphocytes Relative: 2 %
Lymphs Abs: 0.3 10*3/uL — ABNORMAL LOW (ref 0.7–4.0)
MCH: 31.8 pg (ref 26.0–34.0)
MCHC: 32.9 g/dL (ref 30.0–36.0)
MCV: 96.7 fL (ref 80.0–100.0)
Monocytes Absolute: 0.1 10*3/uL (ref 0.1–1.0)
Monocytes Relative: 1 %
Neutro Abs: 10.6 10*3/uL — ABNORMAL HIGH (ref 1.7–7.7)
Neutrophils Relative %: 97 %
Platelets: 173 10*3/uL (ref 150–400)
RBC: 3.62 MIL/uL — ABNORMAL LOW (ref 3.87–5.11)
RDW: 11.7 % (ref 11.5–15.5)
WBC: 11 10*3/uL — ABNORMAL HIGH (ref 4.0–10.5)
nRBC: 0 % (ref 0.0–0.2)

## 2022-01-24 LAB — RESP PANEL BY RT-PCR (RSV, FLU A&B, COVID)  RVPGX2
Influenza A by PCR: NEGATIVE
Influenza B by PCR: NEGATIVE
Resp Syncytial Virus by PCR: NEGATIVE
SARS Coronavirus 2 by RT PCR: NEGATIVE

## 2022-01-24 LAB — LACTIC ACID, PLASMA: Lactic Acid, Venous: 4.4 mmol/L (ref 0.5–1.9)

## 2022-01-24 LAB — LIPASE, BLOOD: Lipase: 65 U/L — ABNORMAL HIGH (ref 11–51)

## 2022-01-24 LAB — BILIRUBIN, FRACTIONATED(TOT/DIR/INDIR)
Bilirubin, Direct: 1.1 mg/dL — ABNORMAL HIGH (ref 0.0–0.2)
Indirect Bilirubin: 0.7 mg/dL (ref 0.3–0.9)
Total Bilirubin: 1.8 mg/dL — ABNORMAL HIGH (ref 0.3–1.2)

## 2022-01-24 LAB — ETHANOL: Alcohol, Ethyl (B): <10 mg/dL (ref <10)

## 2022-01-24 MED ORDER — SODIUM CHLORIDE 0.9 % IV SOLN
1.0000 g | Freq: Once | INTRAVENOUS | Status: AC
  Administered 2022-01-24: 1 g via INTRAVENOUS
  Filled 2022-01-24: qty 10

## 2022-01-24 MED ORDER — SODIUM CHLORIDE 0.9 % IV SOLN: INTRAVENOUS | Status: DC

## 2022-01-24 MED ORDER — SODIUM CHLORIDE 0.9 % IV SOLN: 2.0000 g | INTRAVENOUS | Status: DC

## 2022-01-24 MED ORDER — MORPHINE SULFATE (PF) 2 MG/ML IV SOLN: 2.0000 mg | INTRAVENOUS | Status: DC | PRN

## 2022-01-24 MED ORDER — MORPHINE SULFATE (PF) 4 MG/ML IV SOLN
6.0000 mg | Freq: Once | INTRAVENOUS | Status: AC
  Administered 2022-01-24: 6 mg via INTRAVENOUS
  Filled 2022-01-24: qty 2

## 2022-01-24 MED ORDER — SODIUM CHLORIDE 0.9 % IV BOLUS
1000.0000 mL | Freq: Once | INTRAVENOUS | Status: AC
  Administered 2022-01-24: 1000 mL via INTRAVENOUS

## 2022-01-24 MED ORDER — HYDRALAZINE HCL 20 MG/ML IJ SOLN: 5.0000 mg | Freq: Four times a day (QID) | INTRAMUSCULAR | Status: DC | PRN

## 2022-01-24 MED ORDER — ONDANSETRON HCL 4 MG/2ML IJ SOLN
4.0000 mg | Freq: Once | INTRAMUSCULAR | Status: AC
  Administered 2022-01-24: 4 mg via INTRAVENOUS
  Filled 2022-01-24: qty 2

## 2022-01-24 MED ORDER — IOHEXOL 300 MG/ML  SOLN
80.0000 mL | Freq: Once | INTRAMUSCULAR | Status: AC | PRN
  Administered 2022-01-24: 80 mL via INTRAVENOUS

## 2022-01-24 MED ORDER — ENOXAPARIN SODIUM 40 MG/0.4ML IJ SOSY
40.0000 mg | PREFILLED_SYRINGE | INTRAMUSCULAR | Status: DC
  Administered 2022-01-25: 40 mg via SUBCUTANEOUS
  Filled 2022-01-24 (×2): qty 0.4

## 2022-01-24 MED ORDER — ALBUTEROL SULFATE (2.5 MG/3ML) 0.083% IN NEBU: 2.5000 mg | INHALATION_SOLUTION | RESPIRATORY_TRACT | Status: DC | PRN

## 2022-01-24 MED ORDER — ONDANSETRON 4 MG PO TBDP
4.0000 mg | ORAL_TABLET | Freq: Once | ORAL | Status: AC
  Administered 2022-01-24: 4 mg via ORAL
  Filled 2022-01-24: qty 1

## 2022-01-24 MED ORDER — SODIUM CHLORIDE 0.9% FLUSH
3.0000 mL | Freq: Two times a day (BID) | INTRAVENOUS | Status: DC
  Administered 2022-01-24 – 2022-01-27 (×4): 3 mL via INTRAVENOUS

## 2022-01-24 NOTE — ED Triage Notes (Signed)
Pt presents to the ED via [facility] from home due to abdominal pain that radiates to the back. Pt states the pain is mid epigastric pain. Pt vomits x3. Pt A&Ox4

## 2022-01-24 NOTE — ED Triage Notes (Signed)
Lab called for assistance with blood draw, spoke with michelle

## 2022-01-24 NOTE — ED Provider Notes (Addendum)
[facility] Provider Note    Event Date/Time   First MD Initiated Contact with Patient 01/24/22 1744     (approximate)   History   Abdominal Pain   HPI  Ana Peterson is a 77 y.o. female   presents to the emergency department abdominal pain.  Abdominal pain started just prior to arrival.  Severe upper abdominal pain that radiated to her back.  Associated with multiple episodes of nausea and vomiting.  Also endorses u pain with urination.  Denies any falls or trauma.  Last bowel movement was yesterday and was normal.  Denies any blood in her stool.  Does endorse daily alcohol use.  Denies any history of pancreatitis.  No prior abdominal surgeries.      Physical Exam   Triage Vital Signs: ED Triage Vitals  Enc Vitals Group     BP 01/24/22 1643 126/62     Pulse Rate 01/24/22 1643 93     Resp 01/24/22 1643 18     Temp 01/24/22 1643 98.4 F (36.9 C)     Temp Source 01/24/22 1643 Oral     SpO2 01/24/22 1643 97 %     Weight --      Height --      Head Circumference --      Peak Flow --      Pain Score 01/24/22 1645 7     Pain Loc --      Pain Edu? --      Excl. in [place]? --     Most recent vital signs: Vitals:   01/24/22 1643  BP: 126/62  Pulse: 93  Resp: 18  Temp: 98.4 F (36.9 C)  SpO2: 97%    Physical Exam Constitutional:      General: She is in acute distress.     Appearance: She is well-developed.     Comments: Actively vomiting  HENT:     Head: Atraumatic.  Eyes:     Conjunctiva/sclera: Conjunctivae normal.  Cardiovascular:     Rate and Rhythm: Regular rhythm.  Pulmonary:     Effort: No respiratory distress.  Abdominal:     General: There is no distension.     Tenderness: There is abdominal tenderness in the epigastric area and suprapubic area.  Musculoskeletal:        General: Normal range of motion.     Cervical back: Normal range of motion.  Skin:    General: Skin is warm.  Neurological:     Mental Status: She is  alert. Mental status is at baseline.     IMPRESSION / MDM / ASSESSMENT AND PLAN / ED COURSE  I reviewed the triage vital signs and the nursing notes.  Differential diagnosis including viral illness included COVID/influenza, gastritis/PUD, pancreatitis, symptomatic cholelithiasis, acute cholecystitis  Given 1 L of IV fluids, IV antiemetics   RADIOLOGY I independently reviewed imaging, my interpretation of imaging: CT abdomen and pelvis with contrast  LABS (all labs ordered are listed, but only abnormal results are displayed) Labs interpreted as -  Lab work consistent with leukocytosis.  Elevated T. bili at 1.8.  Elevated LFTs with AST in the 600s and ALT in the 200s.  Lipase 65.  UA with findings concerning for possible urinary tract infection.  Patient has had symptoms of UTI.  Labs Reviewed  CBC WITH DIFFERENTIAL/PLATELET - Abnormal; Notable for the following components:      Result Value   WBC 11.0 (*)    RBC 3.62 (*)  Hemoglobin 11.5 (*)    HCT 35.0 (*)    Neutro Abs 10.6 (*)    Lymphs Abs 0.3 (*)    All other components within normal limits  COMPREHENSIVE METABOLIC PANEL - Abnormal; Notable for the following components:   CO2 19 (*)    Glucose, Bld 144 (*)    BUN 28 (*)    Creatinine, Ser 1.36 (*)    AST 624 (*)    ALT 239 (*)    Total Bilirubin 1.8 (*)    GFR, Estimated 40 (*)    All other components within normal limits  LIPASE, BLOOD - Abnormal; Notable for the following components:   Lipase 65 (*)    All other components within normal limits  URINALYSIS, ROUTINE W REFLEX MICROSCOPIC - Abnormal; Notable for the following components:   Color, Urine AMBER (*)    APPearance CLOUDY (*)    Hgb urine dipstick MODERATE (*)    Protein, ur 30 (*)    Leukocytes,Ua TRACE (*)    Bacteria, UA RARE (*)    All other components within normal limits  RESP PANEL BY RT-PCR (RSV, FLU A&B, COVID)  RVPGX2  URINE CULTURE  CULTURE, BLOOD (ROUTINE X 2)  CULTURE, BLOOD (ROUTINE X  2)  ETHANOL  LACTIC ACID, PLASMA  LACTIC ACID, PLASMA  BILIRUBIN, FRACTIONATED(TOT/DIR/INDIR)    TREATMENT    Given her symptoms of urinary tract infection patient was given IV Rocephin and urine culture was obtained.  CO2 of 19, added on lactic acid and will add on blood cultures.  CT abdomen and pelvis with contrast showed cholelithiasis.  Small amount of air in the bladder concerning for possible UTI.  Uterine fibroids.  Common bile duct is dilated to 14 mm on CT scan.  Discussed the patient's case with Dr. Louretta Parma, recommended MRCP and fractionated bilirubin.  Will evaluate the patient tomorrow.  Recommended admission to the hospitalist.  Dr. Allen Norris will be available tomorrow.  PROCEDURES:  Critical Care performed: No  Procedures  Patient's presentation is most consistent with acute presentation with potential threat to life or bodily function.   MEDICATIONS ORDERED IN ED: Medications  morphine (PF) 4 MG/ML injection 6 mg (has no administration in time range)  ondansetron (ZOFRAN-ODT) disintegrating tablet 4 mg (4 mg Oral Given 01/24/22 1649)  ondansetron (ZOFRAN) injection 4 mg (4 mg Intravenous Given 01/24/22 1829)  sodium chloride 0.9 % bolus 1,000 mL (1,000 mLs Intravenous New Bag/Given 01/24/22 1830)  cefTRIAXone (ROCEPHIN) 1 g in sodium chloride 0.9 % 100 mL IVPB (0 g Intravenous Stopped 01/24/22 2017)  iohexol (OMNIPAQUE) 300 MG/ML solution 80 mL (80 mLs Intravenous Contrast Given 01/24/22 1913)    FINAL CLINICAL IMPRESSION(S) / ED DIAGNOSES   Final diagnoses:  Epigastric pain     Rx / DC Orders   ED Discharge Orders     None        Note:  This document was prepared using Dragon voice recognition software and may include unintentional dictation errors.   Nathaniel Man, MD 01/24/22 2014    Nathaniel Man, MD 01/24/22 2033

## 2022-01-24 NOTE — H&P (Addendum)
History and Physical    Patient: Ana Peterson SWF:[id number] DOB: 03-Feb-1945 DOA: 01/24/2022 DOS: the patient was seen and examined on 01/24/2022 PCP: Freddy Finner, NP  Patient coming from: Home  Chief Complaint: I have sever abdominal pain, worse than previously experienced Chief Complaint  Patient presents with   Abdominal Pain   HPI: Patient is a poor historian due to hearing impairment.  Interpreter services were used but was limited due to patient difficulty hearing and understanding interpreter  Ana Peterson is a 77 y.o. female with medical history significant of Hypertension and obesity.  She presented to the emergency room on account of severe abdominal pain.  Pain is in epigastric region and radiates into her back.  She rated pain as 10/10 in intensity.  Patient admitted to previous episodes of similar abdominal pain, but none also severe status.  This necessitated had to come to the emergency room to be further evaluated.  Symptoms were associated with vomiting x 3.  Vomitus was bilious.   She denied any prior history of being diagnosed with gallstones.  In the ED, workup including CT scan of the abdomen pelvis were done.  Showed evidence of cholelithiasis.  Common bile duct was noted to be dilated to 14 mm.  She was also noted to have evidence of elevated transaminases.  Based on findings, this was concerning for obstructive biliary disease.  The ED did discuss case with Dr. Louretta Parma of gastroenterology who recommended MRCP and fractionated bilirubin test.  Based upon findings of MRCP, GI team will reevaluate patient for ERCP.  Patient was referred to hospitalist service for admission.   Review of Systems: unable to review all systems due to the inability of the patient to answer questions. Past Medical History:  Diagnosis Date   Edema    legs/feet   Hypertension    Syncope    Vertigo    Past Surgical History:  Procedure Laterality Date   CATARACT EXTRACTION W/PHACO  Right 07/22/2014   Procedure: CATARACT EXTRACTION PHACO AND INTRAOCULAR LENS PLACEMENT (IOC);  Surgeon: Birder Robson, MD;  Location: [facility] ORS;  Service: Ophthalmology;  Laterality: Right;  [place]   1:02.7 AP%  25.3 CDE  15.88 fluid pack lot # [id number]   EYE SURGERY     Social History:  has no history on file for tobacco use, alcohol use, and drug use.  No Known Allergies  Family History  Problem Relation Age of Onset   Breast cancer Neg Hx     Prior to Admission medications   Medication Sig Start Date End Date Taking? Authorizing Provider  lisinopril (PRINIVIL,ZESTRIL) 40 MG tablet Take 40 mg by mouth daily.    [provider]    Physical Exam: Vitals:   01/24/22 1643 01/24/22 2043  BP: 126/62 (!) 134/59  Pulse: 93 (!) 105  Resp: 18 17  Temp: 98.4 F (36.9 C) 98.3 F (36.8 C)  TempSrc: Oral Oral  SpO2: 97% 96%   Patient is a pleasant female.  She is obese.  She was laying comfortably in bed not in any acute distress.  She is hard of hearing. Head: Atraumatic.  Ear nose and throat within normal limits. Neck supple with no JVD Chest: Clinically diminished due to body habitus.  No rales or rhonchi however appreciated. Cardiovascular: S1-S2 with no murmur. Abdomen: Soft nontender.  Mild epigastric and right upper quadrant guarding.  No rebound tenderness appreciated. Extremities without pedal edema.   CNS shows no focal deficits  Data Reviewed: Sodium 137, potassium  4.2 chloride 105 bicarb 19 glucose 144 BUN 28, creatinine 1.36, AST 624, ALT 239, lipase 65 total bilirubin 1.8.  WBC 11, hemoglobin 11.5, hematocrit 35, platelet 173. CT abdomen:1. Cholelithiasis. 2. Colonic diverticulosis. 3. Small amount of air within the lumen of a poorly distended urinary bladder. This may be secondary to recent catheterization. Correlation with urinalysis is recommended to exclude the presence of an acute cystitis. 4. Uterine fibroid. 5. Hyperdense focus within the endometrium  which may represent a small amount of blood products. Correlation with nonemergent pelvic ultrasound is recommended, as a small mass cannot be excluded. 6. Aortic atherosclerosis. Assessment and Plan:  77 year old female with hypertension who presents with severe epigastric abdominal pain radiating to her back.  CT scan with findings of obstructive biliary disease.  Ultrasound confirmed dilated common bile duct measuring 16 mm.  Abnormal LFTs suggesting possible obstructive airway disease.  Patient's symptoms concerning for early pancreatitis.  MRCP was ordered in the ED and pending.  Acute RUQ/epigastric abdominal pain secondary to cholelithiasis/ choledocholithiasis and early signs of acute pancreatitis.  Ultrasound confirmed dilated common bile duct measuring 16 mm.  Patient's abdominal pain being optimized with analgesics.  IV fluids is advised at this time.  MRCP has been ordered for further evaluation.  Based off of findings, will consider ERCP.  GI is involved and will address these in AM.  Patient will remain NPO.  Consider General surgery input post ERCP for lap cholecystectomy.  Acute pancreatitis: Mild.  Likely due to obstructive biliary disease.  Elevated lactic acidosis: Patient will be adequately volume repleted with IV fluids.  Follow-up with lactic acid levels.  No signs of sepsis at this time.  Hypertension: Patient is on lisinopril at home.  Hydralazine as needed for blood pressure control.  Mild elevated blood glucose: A1c has been ordered and pending.  Fingerstick glucose monitoring for now.  No insulin coverage was ordered at this time.  Close monitoring however advised.  Abnormal urinalysis: Patient is asymptomatic at this time.  Patient received IV antibiotics in the ED.  Will continue with antibiotics for now and consider de-escalating if urine cultures come back negative.   Advance Care Planning:   Code Status: Full Code   Consults: Gastroenterology consult  Family  Communication: Attempts to reach daughter via cell phone has been unsuccessful  Severity of Illness: The appropriate patient status for this patient is OBSERVATION. Observation status is judged to be reasonable and necessary in order to provide the required intensity of service to ensure the patient's safety. The patient's presenting symptoms, physical exam findings, and initial radiographic and laboratory data in the context of their medical condition is felt to place them at decreased risk for further clinical deterioration. Furthermore, it is anticipated that the patient will be medically stable for discharge from the hospital within 2 midnights of admission.   Author: Artist Beach, MD 01/24/2022 10:25 PM  For on call review [email or web address].

## 2022-01-24 NOTE — ED Provider Triage Note (Signed)
Emergency Medicine Provider Triage Evaluation Note History limited by Spanish language.  Tele-interpreter used for interview in triage.    Josie Dixon, a 77 y.o. female  was evaluated in triage.  Pt complains of generalized abd pain and NV. She notes 3 days of symptoms. She presents to the ED via EMS from home.  Review of Systems  Positive: NVD, abd pain  Negative: FCS  Physical Exam  BP 126/62   Pulse 93   Temp 98.4 F (36.9 C) (Oral)   Resp 18   SpO2 97%  Gen:   Awake, no distress   Resp:  Normal effort  MSK:   Moves extremities without difficulty  Other:    Medical Decision Making  Medically screening exam initiated at 4:45 PM.  Appropriate orders placed.  Bailyn Spackman was informed that the remainder of the evaluation will be completed by another provider, this initial triage assessment does not replace that evaluation, and the importance of remaining in the ED until their evaluation is complete.  Patient to the ED for evaluation of 3 days of nausea, vomiting, and diarrhea.   Melvenia Needles, PA-C 01/24/22 1650

## 2022-01-24 NOTE — ED Notes (Signed)
Patient transported to CT 

## 2022-01-24 NOTE — ED Triage Notes (Signed)
Pt in via EMS from home with c/u mid epigastric pain that radiates to back. Pt also vomited x's 3. Pt needs interpreter.

## 2022-01-24 NOTE — ED Provider Notes (Signed)
-----------------------------------------   8:01 PM on 01/24/2022 -----------------------------------------  Blood pressure (!) 134/59, pulse (!) 105, temperature 98.3 F (36.8 C), temperature source Oral, resp. rate 17, SpO2 96 %.  Assuming care from Dr. Jori Moll.  In short, Ana Peterson is a 77 y.o. female with a chief complaint of Abdominal Pain .  Refer to the original H&P for additional details.  The current plan of care is to admit for MRCP.  ----------------------------------------- 9:57 PM on 01/24/2022 -----------------------------------------  Lactic 3.0.  Patient has already received IV antibiotics and fluids. Hospitalist service paged for admission for MRCP tomorrow. Patient denies pain at this time.     Victorino Dike, FNP 01/25/22 0024    Nathaniel Man, MD 01/29/22 [phone]

## 2022-01-25 ENCOUNTER — Inpatient Hospital Stay: Payer: Medicare Other | Admitting: Certified Registered Nurse Anesthetist

## 2022-01-25 ENCOUNTER — Encounter: Payer: Self-pay | Admitting: Internal Medicine

## 2022-01-25 ENCOUNTER — Other Ambulatory Visit: Payer: Self-pay

## 2022-01-25 ENCOUNTER — Inpatient Hospital Stay: Payer: Medicare Other

## 2022-01-25 ENCOUNTER — Encounter
Admission: EM | Disposition: A | Discharge status: Home Health | Payer: Self-pay | Source: Home / Self Care | Attending: Student

## 2022-01-25 DIAGNOSIS — B9620 Unspecified Escherichia coli [E. coli] as the cause of diseases classified elsewhere: Secondary | ICD-10-CM

## 2022-01-25 DIAGNOSIS — K8050 Calculus of bile duct without cholangitis or cholecystitis without obstruction: Secondary | ICD-10-CM | POA: Diagnosis not present

## 2022-01-25 DIAGNOSIS — Z1612 Extended spectrum beta lactamase (ESBL) resistance: Secondary | ICD-10-CM

## 2022-01-25 DIAGNOSIS — A498 Other bacterial infections of unspecified site: Secondary | ICD-10-CM | POA: Diagnosis not present

## 2022-01-25 DIAGNOSIS — K8309 Other cholangitis: Secondary | ICD-10-CM | POA: Diagnosis not present

## 2022-01-25 DIAGNOSIS — K8033 Calculus of bile duct with acute cholangitis with obstruction: Secondary | ICD-10-CM | POA: Diagnosis not present

## 2022-01-25 DIAGNOSIS — K851 Biliary acute pancreatitis without necrosis or infection: Secondary | ICD-10-CM | POA: Diagnosis not present

## 2022-01-25 HISTORY — PX: ERCP: SHX5425

## 2022-01-25 LAB — BLOOD CULTURE ID PANEL (REFLEXED) - BCID2

## 2022-01-25 LAB — GLUCOSE, CAPILLARY
Glucose-Capillary: 120 mg/dL — ABNORMAL HIGH (ref 70–99)
Glucose-Capillary: 81 mg/dL (ref 70–99)
Glucose-Capillary: 92 mg/dL (ref 70–99)

## 2022-01-25 LAB — COMPREHENSIVE METABOLIC PANEL
ALT: 207 U/L — ABNORMAL HIGH (ref 0–44)
AST: 290 U/L — ABNORMAL HIGH (ref 15–41)
Albumin: 2.9 g/dL — ABNORMAL LOW (ref 3.5–5.0)
Alkaline Phosphatase: 59 U/L (ref 38–126)
Anion gap: 5 (ref 5–15)
BUN: 25 mg/dL — ABNORMAL HIGH (ref 8–23)
CO2: 21 mmol/L — ABNORMAL LOW (ref 22–32)
Calcium: 8 mg/dL — ABNORMAL LOW (ref 8.9–10.3)
Chloride: 113 mmol/L — ABNORMAL HIGH (ref 98–111)
Creatinine, Ser: 1.17 mg/dL — ABNORMAL HIGH (ref 0.44–1.00)
GFR, Estimated: 48 mL/min — ABNORMAL LOW (ref >60)
Glucose, Bld: 146 mg/dL — ABNORMAL HIGH (ref 70–99)
Potassium: 4.6 mmol/L (ref 3.5–5.1)
Sodium: 139 mmol/L (ref 135–145)
Total Bilirubin: 2.2 mg/dL — ABNORMAL HIGH (ref 0.3–1.2)
Total Protein: 6.1 g/dL — ABNORMAL LOW (ref 6.5–8.1)

## 2022-01-25 LAB — LIPID PANEL
Cholesterol: 187 mg/dL (ref 0–200)
HDL: 55 mg/dL (ref >40)
LDL Cholesterol: 117 mg/dL — ABNORMAL HIGH (ref 0–99)
Total CHOL/HDL Ratio: 3.4 RATIO
Triglycerides: 73 mg/dL (ref <150)
VLDL: 15 mg/dL (ref 0–40)

## 2022-01-25 LAB — CBC
HCT: 27.3 % — ABNORMAL LOW (ref 36.0–46.0)
Hemoglobin: 9 g/dL — ABNORMAL LOW (ref 12.0–15.0)
MCH: 31.7 pg (ref 26.0–34.0)
MCHC: 33 g/dL (ref 30.0–36.0)
MCV: 96.1 fL (ref 80.0–100.0)
Platelets: 152 10*3/uL (ref 150–400)
RBC: 2.84 MIL/uL — ABNORMAL LOW (ref 3.87–5.11)
RDW: 12 % (ref 11.5–15.5)
WBC: 20.1 10*3/uL — ABNORMAL HIGH (ref 4.0–10.5)
nRBC: 0 % (ref 0.0–0.2)

## 2022-01-25 LAB — HEMOGLOBIN A1C
Hgb A1c MFr Bld: 6.2 % — ABNORMAL HIGH (ref 4.8–5.6)
Mean Plasma Glucose: 131 mg/dL

## 2022-01-25 LAB — LACTIC ACID, PLASMA: Lactic Acid, Venous: 3 mmol/L (ref 0.5–1.9)

## 2022-01-25 SURGERY — ERCP, WITH INTERVENTION IF INDICATED
Anesthesia: Monitor Anesthesia Care

## 2022-01-25 MED ORDER — LACTATED RINGERS IV SOLN: INTRAVENOUS | Status: DC

## 2022-01-25 MED ORDER — FENTANYL CITRATE (PF) 100 MCG/2ML IJ SOLN
INTRAMUSCULAR | Status: AC
  Filled 2022-01-25: qty 2

## 2022-01-25 MED ORDER — ONDANSETRON HCL 4 MG/2ML IJ SOLN
4.0000 mg | Freq: Four times a day (QID) | INTRAMUSCULAR | Status: DC | PRN
  Administered 2022-01-25: 4 mg via INTRAVENOUS
  Filled 2022-01-25: qty 2

## 2022-01-25 MED ORDER — LACTATED RINGERS IV SOLN: Freq: Once | INTRAVENOUS | Status: DC

## 2022-01-25 MED ORDER — SODIUM CHLORIDE 0.9 % IV SOLN
1.0000 g | Freq: Two times a day (BID) | INTRAVENOUS | Status: DC
  Administered 2022-01-25 – 2022-01-28 (×7): 1 g via INTRAVENOUS
  Filled 2022-01-25 (×6): qty 1
  Filled 2022-01-25 (×2): qty 20
  Filled 2022-01-25: qty 1
  Filled 2022-01-25: qty 20

## 2022-01-25 MED ORDER — GLUCAGON HCL RDNA (DIAGNOSTIC) 1 MG IJ SOLR
INTRAMUSCULAR | Status: DC | PRN
  Administered 2022-01-25: .5 mg via INTRAVENOUS

## 2022-01-25 MED ORDER — PROPOFOL 500 MG/50ML IV EMUL
INTRAVENOUS | Status: DC | PRN
  Administered 2022-01-25: 120 ug/kg/min via INTRAVENOUS

## 2022-01-25 MED ORDER — LIDOCAINE HCL (CARDIAC) PF 100 MG/5ML IV SOSY
PREFILLED_SYRINGE | INTRAVENOUS | Status: DC | PRN
  Administered 2022-01-25: 100 mg via INTRAVENOUS

## 2022-01-25 MED ORDER — DICLOFENAC SUPPOSITORY 100 MG
100.0000 mg | Freq: Once | RECTAL | Status: AC
  Administered 2022-01-25: 100 mg via RECTAL
  Filled 2022-01-25: qty 1

## 2022-01-25 MED ORDER — ACETAMINOPHEN 325 MG PO TABS
650.0000 mg | ORAL_TABLET | Freq: Four times a day (QID) | ORAL | Status: DC | PRN
  Administered 2022-01-26: 650 mg via ORAL
  Filled 2022-01-25: qty 2

## 2022-01-25 MED ORDER — SODIUM CHLORIDE 0.9 % IV SOLN: INTRAVENOUS | Status: DC

## 2022-01-25 MED ORDER — PROPOFOL 10 MG/ML IV BOLUS
INTRAVENOUS | Status: DC | PRN
  Administered 2022-01-25: 30 mg via INTRAVENOUS
  Administered 2022-01-25: 20 mg via INTRAVENOUS

## 2022-01-25 MED ORDER — DICLOFENAC SUPPOSITORY 100 MG
RECTAL | Status: AC
  Filled 2022-01-25: qty 1

## 2022-01-25 MED ORDER — PIPERACILLIN-TAZOBACTAM 3.375 G IVPB: 3.3750 g | Freq: Three times a day (TID) | INTRAVENOUS | Status: DC

## 2022-01-25 MED ORDER — GLUCAGON HCL RDNA (DIAGNOSTIC) 1 MG IJ SOLR
INTRAMUSCULAR | Status: AC
  Filled 2022-01-25: qty 1

## 2022-01-25 NOTE — Progress Notes (Signed)
PHARMACY - PHYSICIAN COMMUNICATION CRITICAL VALUE ALERT - BLOOD CULTURE IDENTIFICATION (BCID)  Ana Peterson is an 77 y.o. female w/ PMH of HTN who presented to [place] on 01/24/2022 with a chief complaint of abdominal pain ISO pancreatitis   Assessment:  1/4 ESBL E coli (suspected source intraabdominal)  Name of physician contacted: Edd Fabian, MD  Current antibiotics: Zosyn 3.375 grams IV every 8 hours EI  Changes to prescribed antibiotics recommended:   Change antibiotics to meropenem 1 gram IV every 12 hours  Results for orders placed or performed during the hospital encounter of 01/24/22  Blood Culture ID Panel (Reflexed) (Collected: 01/24/2022  8:36 PM)  Result Value Ref Range   Enterococcus faecalis NOT DETECTED NOT DETECTED   Enterococcus Faecium NOT DETECTED NOT DETECTED   Listeria monocytogenes NOT DETECTED NOT DETECTED   Staphylococcus species NOT DETECTED NOT DETECTED   Staphylococcus aureus (BCID) NOT DETECTED NOT DETECTED   Staphylococcus epidermidis NOT DETECTED NOT DETECTED   Staphylococcus lugdunensis NOT DETECTED NOT DETECTED   Streptococcus species NOT DETECTED NOT DETECTED   Streptococcus agalactiae NOT DETECTED NOT DETECTED   Streptococcus pneumoniae NOT DETECTED NOT DETECTED   Streptococcus pyogenes NOT DETECTED NOT DETECTED   A.calcoaceticus-baumannii NOT DETECTED NOT DETECTED   Bacteroides fragilis NOT DETECTED NOT DETECTED   Enterobacterales DETECTED (A) NOT DETECTED   Enterobacter cloacae complex NOT DETECTED NOT DETECTED   Escherichia coli DETECTED (A) NOT DETECTED   Klebsiella aerogenes NOT DETECTED NOT DETECTED   Klebsiella oxytoca NOT DETECTED NOT DETECTED   Klebsiella pneumoniae NOT DETECTED NOT DETECTED   Proteus species NOT DETECTED NOT DETECTED   Salmonella species NOT DETECTED NOT DETECTED   Serratia marcescens NOT DETECTED NOT DETECTED   Haemophilus influenzae NOT DETECTED NOT DETECTED   Neisseria meningitidis NOT DETECTED NOT DETECTED    Pseudomonas aeruginosa NOT DETECTED NOT DETECTED   Stenotrophomonas maltophilia NOT DETECTED NOT DETECTED   Candida albicans NOT DETECTED NOT DETECTED   Candida auris NOT DETECTED NOT DETECTED   Candida glabrata NOT DETECTED NOT DETECTED   Candida krusei NOT DETECTED NOT DETECTED   Candida parapsilosis NOT DETECTED NOT DETECTED   Candida tropicalis NOT DETECTED NOT DETECTED   Cryptococcus neoformans/gattii NOT DETECTED NOT DETECTED   CTX-M ESBL DETECTED (A) NOT DETECTED   Carbapenem resistance IMP NOT DETECTED NOT DETECTED   Carbapenem resistance KPC NOT DETECTED NOT DETECTED   Carbapenem resistance NDM NOT DETECTED NOT DETECTED   Carbapenem resist OXA 48 LIKE NOT DETECTED NOT DETECTED   Carbapenem resistance VIM NOT DETECTED NOT DETECTED    [name] 01/25/2022  10:49 AM

## 2022-01-25 NOTE — Anesthesia Preprocedure Evaluation (Addendum)
Anesthesia Evaluation  Patient identified by MRN, date of birth, ID band Patient awake    Reviewed: Allergy & Precautions, H&P , NPO status , Patient's Chart, lab work & pertinent test results, reviewed documented beta blocker date and time   Airway Mallampati: III  TM Distance: >3 FB Neck ROM: full    Dental no notable dental hx. (+) Teeth Intact, Dental Advidsory Given   Pulmonary neg pulmonary ROS, neg shortness of breath, neg COPD   Pulmonary exam normal breath sounds clear to auscultation       Cardiovascular Exercise Tolerance: Good hypertension, (-) Past MI and (-) CABG  Rhythm:regular Rate:Normal     Neuro/Psych negative neurological ROS  negative psych ROS   GI/Hepatic negative GI ROS, Neg liver ROS,,,  Endo/Other  negative endocrine ROS    Renal/GU      Musculoskeletal   Abdominal   Peds  Hematology negative hematology ROS (+)   Anesthesia Other Findings   Reproductive/Obstetrics negative OB ROS                             Anesthesia Physical Anesthesia Plan  ASA: 2  Anesthesia Plan: MAC   Post-op Pain Management: Minimal or no pain anticipated   Induction: Intravenous  PONV Risk Score and Plan: 3 and Ondansetron, Propofol infusion and TIVA  Airway Management Planned: Nasal Cannula  Additional Equipment: None  Intra-op Plan:   Post-operative Plan:   Informed Consent: I have reviewed the patients History and Physical, chart, labs and discussed the procedure including the risks, benefits and alternatives for the proposed anesthesia with the patient or authorized representative who has indicated his/her understanding and acceptance.     Dental advisory given  Plan Discussed with: CRNA  Anesthesia Plan Comments: (Discussed risks of anesthesia with patient, including possibility of difficulty with spontaneous ventilation under anesthesia necessitating airway  intervention, PONV, and rare risks such as cardiac or respiratory or neurological events, and allergic reactions. Discussed the role of CRNA in patient's perioperative care. Patient understands.)        Anesthesia Quick Evaluation

## 2022-01-25 NOTE — Progress Notes (Signed)
[facility] Progress Note  Patient: Dashanti Burr    YSA:[id number]  DOA: 01/24/2022     Date of Service: the patient was seen and examined on 01/25/2022  Chief Complaint  Patient presents with   Abdominal Pain   Brief hospital course: Ericka Marcellus is a 77 y.o. female with medical history significant of Hypertension and obesity.  She presented to the emergency room on account of severe abdominal pain, associated with nausea and vomiting. In the ED, workup including CT scan of the abdomen pelvis were done. Showed evidence of cholelithiasis. Common bile duct was noted to be dilated to 14 mm. She was also noted to have evidence of elevated transaminases. Based on findings, this was concerning for obstructive biliary disease. The ED did discuss case with Dr. Louretta Parma of gastroenterology who recommended MRCP and fractionated bilirubin test. Based upon findings of MRCP, GI team will reevaluate patient for ERCP. Patient was referred to hospitalist service for admission.    Assessment and Plan:  Choledocholithiasis and cholelithiasis Patient presented with abdominal pain, nausea and vomiting MRCP positive for CBD filling defect, recommended ERCP GI was consulted, ERCP was done and CBD stone was removed. Started clear liquid diet, advance as per tolerance tomorrow a.m. Watch for any signs of pancreatitis Continue symptomatic treatment for pain control Continue IV fluid for hydration GI following.   E. coli ESBL bacteremia due to cholangitis Sepsis due to E. coli ESBL bacteremia Patient was hypotensive and elevated lactic acid level, leukocytosis S/p ceftriaxone which was discontinued and started on meropenem 1 g IV every 12 hours ID consulted Follow repeat blood cultures tomorrow a.m.   Acute pancreatitis due to CBD stone Continue IV fluid for hydration Trend lipase level   Hypertension, blood pressure remained soft Patient was on lisinopril and HCTZ prn at home which has been  held for now Continue to monitor BP Use IV hydralazine as needed if blood pressure remains high   Mild hyperglycemia Check hemoglobin A1c   Body mass index is 27.46 kg/m.  Interventions:    Diet: Clear liquid diet DVT Prophylaxis: Subcutaneous Lovenox   Advance goals of care discussion: Full code  Family Communication: family was present at bedside, at the time of interview.  The pt provided permission to discuss medical plan with the family. Opportunity was given to ask question and all questions were answered satisfactorily.   Disposition:  Pt is from Home, admitted with Abd pain due to CBD stone, s/p ERCP, E. coli ESBL bacteremia, still on IV antibiotics, pending repeat blood cultures, ID consulted , which precludes a safe discharge. Discharge to home, when stable and cleared by GI and ID, IV antibiotics..  Subjective: No significant overnight events, patient was admitted due to abdominal pain secondary to CBD stone, pain improved after ERCP.  Physical Exam: General:  NAD Appear in no distress, affect appropriate Eyes: PERRLA ENT: Oral Mucosa Clear, moist  Neck: no JVD,  Cardiovascular: S1 and S2 Present, no Murmur,  Respiratory: good respiratory effort, Bilateral Air entry equal and Decreased, no Crackles, no wheezes Abdomen: Bowel Sound present, Soft and Mild upper Abd tenderness,  Skin: no rashes Extremities: no Pedal edema, no calf tenderness Neurologic: without any new focal findings Gait not checked due to patient safety concerns  Vitals:   01/25/22 1223 01/25/22 1235 01/25/22 1305 01/25/22 1511  BP: (!) 112/57 118/78 (!) 114/56 (!) 102/54  Pulse: 84 84 76 80  Resp: 11  18   Temp:   98.5 F (36.9 C)  TempSrc:      SpO2: 97% 97% 96% 96%  Weight:      Height:        Intake/Output Summary (Last 24 hours) at 01/25/2022 1512 Last data filed at 01/25/2022 0300 Gross per 24 hour  Intake 2495.83 ml  Output --  Net 2495.83 ml   Filed Weights   01/25/22 0340   Weight: 74.8 kg    Data Reviewed: I have personally reviewed and interpreted daily labs, tele strips, imagings as discussed above. I reviewed all nursing notes, pharmacy notes, vitals, pertinent old records I have discussed plan of care as described above with RN and patient/family.  CBC: Recent Labs  Lab 01/24/22 1826 01/25/22 0515  WBC 11.0* 20.1*  NEUTROABS 10.6*  --   HGB 11.5* 9.0*  HCT 35.0* 27.3*  MCV 96.7 96.1  PLT 173 119   Basic Metabolic Panel: Recent Labs  Lab 01/24/22 1826 01/25/22 0515  NA 137 139  K 4.2 4.6  CL 105 113*  CO2 19* 21*  GLUCOSE 144* 146*  BUN 28* 25*  CREATININE 1.36* 1.17*  CALCIUM 9.2 8.0*    Studies: DG C-Arm 1-60 Min-No Report  Result Date: 01/25/2022 Fluoroscopy was utilized by the requesting physician.  No radiographic interpretation.   MR ABDOMEN MRCP WO CONTRAST  Result Date: 01/25/2022 CLINICAL DATA:  Abdominal pain with cholelithiasis. Biliary duct dilatation on recent ultrasound. EXAM: MRI ABDOMEN WITHOUT CONTRAST  (INCLUDING MRCP) TECHNIQUE: Multiplanar multisequence MR imaging of the abdomen was performed. Heavily T2-weighted images of the biliary and pancreatic ducts were obtained, and three-dimensional MRCP images were rendered by post processing. COMPARISON:  Abdomen/pelvis CT 01/24/2022. Abdominal ultrasound exam 01/24/2022. FINDINGS: Motion degraded study secondary to limitations in reproducible breath holding. Lower chest: No pleural effusion. Hepatobiliary: No suspicious focal abnormality in the liver on this study without intravenous contrast. Multiple gallstones are evident measuring in the 10-11 mm size range (see coronal haste image 14 of series 3). Common bile duct diameter measures up to 18 mm. There is a relatively large tubular shaped filling defect in the common duct/common bile duct measuring 1.1 x 0.9 x 5.1 cm (see coronal T2 image 17 of series 3 and 3D MRCP image 38 of series 17). Review of the previous CT shows  increased attenuation in the common duct but no calcification despite calcification of the stones in the gallbladder lumen. Pre contrast T1 imaging of the duct is nondiagnostic due to motion artifact. There is probably at 3-4 mm stone distal to the dominant filling defect near the ampulla. Pancreas: No focal mass lesion. No dilatation of the main duct. No intraparenchymal cyst. No peripancreatic edema. Spleen:  No splenomegaly. No focal mass lesion. Adrenals/Urinary Tract: No adrenal nodule or mass. Kidneys unremarkable. Stomach/Bowel: Stomach is unremarkable. No gastric wall thickening. No evidence of outlet obstruction. Duodenum is normally positioned as is the ligament of Treitz. No small bowel or colonic dilatation within the visualized abdomen. Colonic diverticulosis evident. Vascular/Lymphatic: No abdominal aortic aneurysm. No abdominal lymphadenopathy. Other:  No intraperitoneal free fluid. Musculoskeletal: No suspicious marrow signal abnormality. IMPRESSION: 1. Motion degraded study secondary to limitations in reproducible breath holding. 2. 1.1 x 0.9 x 5.1 cm tubular shaped filling defect in the common duct/common bile duct. Review of the previous CT shows increased attenuation in the common duct lumen but no calcification despite calcification of the stones in the gallbladder lumen. This tubular structure has a cast-like appearance and may be an unusual noncalcified ductal stone. Intraluminal blood clot would  be a consideration although motion degradation on T1 imaging hinders assessment. Given the well-defined margins and low signal intensity on T2 imaging, tumefactive sludge is not considered likely. There may be a 3-4 mm common bile duct stone distal to the dominant filling defect near the ampulla. Consider ERCP to further evaluate. 3. Cholelithiasis. 4. Colonic diverticulosis. Electronically Signed   By: Kennith Center M.D.   On: 01/25/2022 06:21   MR 3D Recon At Scanner  Result Date:  01/25/2022 CLINICAL DATA:  Abdominal pain with cholelithiasis. Biliary duct dilatation on recent ultrasound. EXAM: MRI ABDOMEN WITHOUT CONTRAST  (INCLUDING MRCP) TECHNIQUE: Multiplanar multisequence MR imaging of the abdomen was performed. Heavily T2-weighted images of the biliary and pancreatic ducts were obtained, and three-dimensional MRCP images were rendered by post processing. COMPARISON:  Abdomen/pelvis CT 01/24/2022. Abdominal ultrasound exam 01/24/2022. FINDINGS: Motion degraded study secondary to limitations in reproducible breath holding. Lower chest: No pleural effusion. Hepatobiliary: No suspicious focal abnormality in the liver on this study without intravenous contrast. Multiple gallstones are evident measuring in the 10-11 mm size range (see coronal haste image 14 of series 3). Common bile duct diameter measures up to 18 mm. There is a relatively large tubular shaped filling defect in the common duct/common bile duct measuring 1.1 x 0.9 x 5.1 cm (see coronal T2 image 17 of series 3 and 3D MRCP image 38 of series 17). Review of the previous CT shows increased attenuation in the common duct but no calcification despite calcification of the stones in the gallbladder lumen. Pre contrast T1 imaging of the duct is nondiagnostic due to motion artifact. There is probably at 3-4 mm stone distal to the dominant filling defect near the ampulla. Pancreas: No focal mass lesion. No dilatation of the main duct. No intraparenchymal cyst. No peripancreatic edema. Spleen:  No splenomegaly. No focal mass lesion. Adrenals/Urinary Tract: No adrenal nodule or mass. Kidneys unremarkable. Stomach/Bowel: Stomach is unremarkable. No gastric wall thickening. No evidence of outlet obstruction. Duodenum is normally positioned as is the ligament of Treitz. No small bowel or colonic dilatation within the visualized abdomen. Colonic diverticulosis evident. Vascular/Lymphatic: No abdominal aortic aneurysm. No abdominal  lymphadenopathy. Other:  No intraperitoneal free fluid. Musculoskeletal: No suspicious marrow signal abnormality. IMPRESSION: 1. Motion degraded study secondary to limitations in reproducible breath holding. 2. 1.1 x 0.9 x 5.1 cm tubular shaped filling defect in the common duct/common bile duct. Review of the previous CT shows increased attenuation in the common duct lumen but no calcification despite calcification of the stones in the gallbladder lumen. This tubular structure has a cast-like appearance and may be an unusual noncalcified ductal stone. Intraluminal blood clot would be a consideration although motion degradation on T1 imaging hinders assessment. Given the well-defined margins and low signal intensity on T2 imaging, tumefactive sludge is not considered likely. There may be a 3-4 mm common bile duct stone distal to the dominant filling defect near the ampulla. Consider ERCP to further evaluate. 3. Cholelithiasis. 4. Colonic diverticulosis. Electronically Signed   By: Kennith Center M.D.   On: 01/25/2022 06:21   US Abdomen Limited RUQ (LIVER/GB)  Result Date: 01/24/2022 CLINICAL DATA:  Right upper quadrant pain EXAM: ULTRASOUND ABDOMEN LIMITED RIGHT UPPER QUADRANT COMPARISON:  CT 01/24/2022 FINDINGS: Gallbladder: Multiple gallstones. Normal wall thickness. Negative sonographic Murphy. Common bile duct: Diameter: 16 mm Liver: No focal lesion identified. Within normal limits in parenchymal echogenicity. Portal vein is patent on color Doppler imaging with normal direction of blood flow towards the liver.  Other: None. IMPRESSION: Cholelithiasis without sonographic evidence for acute cholecystitis. Dilated common bile duct measuring 16 mm, correlate with LFTs and consider correlation with MRCP. Electronically Signed   By: Jasmine Pang M.D.   On: 01/24/2022 20:28   CT ABDOMEN PELVIS W CONTRAST  Result Date: 01/24/2022 CLINICAL DATA:  Mid epigastric pain and vomiting. EXAM: CT ABDOMEN AND PELVIS WITH  CONTRAST TECHNIQUE: Multidetector CT imaging of the abdomen and pelvis was performed using the standard protocol following bolus administration of intravenous contrast. RADIATION DOSE REDUCTION: This exam was performed according to the departmental dose-optimization program which includes automated exposure control, adjustment of the mA and/or kV according to patient size and/or use of iterative reconstruction technique. CONTRAST:  53mL OMNIPAQUE IOHEXOL 300 MG/ML  SOLN COMPARISON:  None Available. FINDINGS: Lower chest: Mild atelectasis is seen within the bilateral lung bases. Hepatobiliary: No focal liver abnormality is seen. Multiple 9 mm and 10 mm gallstones are seen within the dependent portion of a moderately distended gallbladder. There is no evidence of gallbladder wall thickening or pericholecystic inflammation. The common bile duct is dilated and measures 14 mm in diameter. Pancreas: Unremarkable. No pancreatic ductal dilatation or surrounding inflammatory changes. Spleen: Normal in size without focal abnormality. Adrenals/Urinary Tract: Adrenal glands are unremarkable. Kidneys are normal, without renal calculi, focal lesion, or hydronephrosis. A small amount of air is seen within the lumen of a poorly distended urinary bladder. Stomach/Bowel: Stomach is within normal limits. Appendix appears normal. No evidence of bowel wall thickening, distention, or inflammatory changes. Noninflamed diverticula are seen throughout the large bowel. Vascular/Lymphatic: Aortic atherosclerosis. No enlarged abdominal or pelvic lymph nodes. Reproductive: A 2.2 cm x 2.0 cm x 2.2 cm heterogeneous uterine fibroid is seen along the anterolateral aspect of the uterine fundus on the right. A 0.9 cm x 0.7 cm x 0.8 cm hyperdense focus is seen within the endometrium. The bilateral adnexa are unremarkable. Other: No abdominal wall hernia or abnormality. No abdominopelvic ascites. Musculoskeletal: No acute or significant osseous  findings. IMPRESSION: 1. Cholelithiasis. 2. Colonic diverticulosis. 3. Small amount of air within the lumen of a poorly distended urinary bladder. This may be secondary to recent catheterization. Correlation with urinalysis is recommended to exclude the presence of an acute cystitis. 4. Uterine fibroid. 5. Hyperdense focus within the endometrium which may represent a small amount of blood products. Correlation with nonemergent pelvic ultrasound is recommended, as a small mass cannot be excluded. 6. Aortic atherosclerosis. Aortic Atherosclerosis (ICD10-I70.0). Electronically Signed   By: Aram Candela M.D.   On: 01/24/2022 19:34    Scheduled Meds:  diclofenac       enoxaparin (LOVENOX) injection  40 mg Subcutaneous Q24H   sodium chloride flush  3 mL Intravenous Q12H   Continuous Infusions:  sodium chloride 125 mL/hr at 01/25/22 1337   meropenem (MERREM) IV 1 g (01/25/22 1338)   PRN Meds: acetaminophen, albuterol, diclofenac, hydrALAZINE, morphine injection, ondansetron (ZOFRAN) IV  Time spent: 35 minutes  Author: Gillis Santa. MD [facility] 01/25/2022 3:12 PM  To reach On-call, see care teams to locate the attending and reach out to them via [email or web address]. If 7PM-7AM, please contact night-coverage If you still have difficulty reaching the attending provider, please page the [facility] (Director on Call) for [facility] on amion for assistance.

## 2022-01-25 NOTE — Consult Note (Signed)
Cephas Darby, MD [address]  [place]  [place], [place] [address]  Main: [phone]  Fax: [phone] Pager: [phone]   Consultation  Referring Provider:     No ref. provider found Primary Care Physician:  Freddy Finner, NP Primary Gastroenterologist: Althia Forts      Reason for Consultation: Elevated LFTs, ? choledocholithiasis  Date of Admission:  01/24/2022 Date of Consultation:  01/25/2022         HPI:   Ana Peterson is a 77 y.o. female with history of hypertension presented with 1 day history of epigastric pain radiating to the back with several episodes of nausea and vomiting.  Patient has similar episodes in the past which was self-limited.  She was hemodynamically stable in the ER.  Labs revealed mild leukocytosis, WBC count 11, hemoglobin 11.5, elevated LFTs AST/ALT 624/239, T. bili 1.8, direct bili 1.1, alkaline phosphatase 80, BUN/creatinine 28/1.13, serum lactic acid was 3.  Patient received 2 L of IV fluids.  She underwent CT abdomen and pelvis with contrast which revealed cholelithiasis, colonic diverticulosis, dilated CBD to 14 mm, ultrasound also revealed dilated CBD to 16 mm, cholelithiasis without sonographic evidence of acute cholecystitis.  Subsequently, patient underwent MRCP which revealed 1.1 x 8.9 x 5.1 cm tubular shaped filling defect in the common bile duct with ?  Blood clot or CBD stone.  She is mildly hypotensive with low-grade fever, leukocytosis worsened to 20 K, transaminases improving, T. bili increased to 2.2.  Serum lipase is 65  Currently on ceftriaxone, maintenance IV fluids, kept n.p.o. patient denies any abdominal pain, nausea or vomiting.   NSAIDs: None  Antiplts/Anticoagulants/Anti thrombotics: None  GI Procedures: None  Past Medical History:  Diagnosis Date   Edema    legs/feet   Hypertension    Syncope    Vertigo     Past Surgical History:  Procedure Laterality Date   CATARACT EXTRACTION W/PHACO Right  07/22/2014   Procedure: CATARACT EXTRACTION PHACO AND INTRAOCULAR LENS PLACEMENT (IOC);  Surgeon: Birder Robson, MD;  Location: [facility] ORS;  Service: Ophthalmology;  Laterality: Right;  [place]   1:02.7 AP%  25.3 CDE  15.88 fluid pack lot # [id number]   EYE SURGERY       Current Facility-Administered Medications:    0.9 %  sodium chloride infusion, , Intravenous, Continuous, Acheampong, Warnell Bureau, MD, Last Rate: 125 mL/hr at 01/24/22 2350, New Bag at 01/24/22 2350   0.9 %  sodium chloride infusion, , Intravenous, Continuous, Gaspare Netzel, Tally Due, MD   [facility] Hold] acetaminophen (TYLENOL) tablet 650 mg, 650 mg, Oral, Q6H PRN, Val Riles, MD   [[facility] Hold] albuterol (PROVENTIL) (2.5 MG/3ML) 0.083% nebulizer solution 2.5 mg, 2.5 mg, Nebulization, Q2H PRN, Acheampong, Warnell Bureau, MD   diclofenac suppository 100 mg, 100 mg, Rectal, Once, Lucilla Lame, MD   [facility] Hold] enoxaparin (LOVENOX) injection 40 mg, 40 mg, Subcutaneous, Q24H, Acheampong, Warnell Bureau, MD, 40 mg at 01/25/22 2035   [facility] Hold] hydrALAZINE (APRESOLINE) injection 5 mg, 5 mg, Intravenous, Q6H PRN, Acheampong, Warnell Bureau, MD   lactated ringers infusion, , Intravenous, Continuous, Allen Norris, Darren, MD   [facility] Hold] morphine (PF) 2 MG/ML injection 2 mg, 2 mg, Intravenous, Q4H PRN, Acheampong, Warnell Bureau, MD   [[facility] Hold] ondansetron [facility]) injection 4 mg, 4 mg, Intravenous, Q6H PRN, Sharion Settler, NP, 4 mg at 01/25/22 0224   [MAR Hold] piperacillin-tazobactam (ZOSYN) IVPB 3.375 g, 3.375 g, Intravenous, Q8H, Val Riles, MD   [facility] Hold] sodium chloride flush (NS)  0.9 % injection 3 mL, 3 mL, Intravenous, Q12H, Acheampong, Warnell Bureau, MD, 3 mL at 01/25/22 6295   Family History  Problem Relation Age of Onset   Breast cancer Neg Hx      Social History   Tobacco Use   Smoking status: Never    Passive exposure: Never   Smokeless tobacco: Never  Vaping Use   Vaping Use: Never used    Allergies as of 01/24/2022   (No Known Allergies)    Review of  Systems:    All systems reviewed and negative except where noted in HPI.   Physical Exam:  Vital signs in last 24 hours: Temp:  [98.3 F (36.8 C)-99.9 F (37.7 C)] 99.2 F (37.3 C) (01/02 0833) Pulse Rate:  [80-105] 80 (01/02 0833) Resp:  [17-20] 18 (01/02 0833) BP: (86-134)/(40-62) 96/52 (01/02 0833) SpO2:  [95 %-99 %] 99 % (01/02 0833) Weight:  [74.8 kg] 74.8 kg (01/02 0340) Last BM Date : 12/23/21 General:   Pleasant, cooperative in NAD Head:  Normocephalic and atraumatic. Eyes:   No icterus.   Conjunctiva pink. PERRLA. Ears:  Normal auditory acuity. Neck:  Supple; no masses or thyroidomegaly Lungs: Respirations even and unlabored. Lungs clear to auscultation bilaterally.   No wheezes, crackles, or rhonchi.  Heart:  Regular rate and rhythm;  Without murmur, clicks, rubs or gallops Abdomen:  Soft, nondistended, nontender, normal bowel sounds. No appreciable masses or hepatomegaly.  No rebound or guarding.  Rectal:  Not performed. Msk:  Symmetrical without gross deformities.  Strength generalized weakness Extremities:  Without edema, cyanosis or clubbing. Neurologic:  Alert and oriented x3;  grossly normal neurologically. Skin:  Intact without significant lesions or rashes. Psych:  Alert and cooperative. Normal affect.  LAB RESULTS:    Latest Ref Rng & Units 01/25/2022    5:15 AM 01/24/2022    6:26 PM  CBC  WBC 4.0 - 10.5 K/uL 20.1  11.0   Hemoglobin 12.0 - 15.0 g/dL 9.0  11.5   Hematocrit 36.0 - 46.0 % 27.3  35.0   Platelets 150 - 400 K/uL 152  173     BMET    Latest Ref Rng & Units 01/25/2022    5:15 AM 01/24/2022    6:26 PM 07/17/2014    9:34 AM  BMP  Glucose 70 - 99 mg/dL 146  144    BUN 8 - 23 mg/dL 25  28    Creatinine 0.44 - 1.00 mg/dL 1.17  1.36    Sodium 135 - 145 mmol/L 139  137    Potassium 3.5 - 5.1 mmol/L 4.6  4.2  4.9   Chloride 98 - 111 mmol/L 113  105    CO2 22 - 32 mmol/L 21  19    Calcium 8.9 - 10.3 mg/dL 8.0  9.2      LFT    Latest Ref Rng &  Units 01/25/2022    5:15 AM 01/24/2022    6:26 PM 01/24/2022    4:45 PM  Hepatic Function  Total Protein 6.5 - 8.1 g/dL 6.1  7.5    Albumin 3.5 - 5.0 g/dL 2.9  3.9    AST 15 - 41 U/L 290  624    ALT 0 - 44 U/L 207  239    Alk Phosphatase 38 - 126 U/L 59  80    Total Bilirubin 0.3 - 1.2 mg/dL 2.2  1.8  1.8   Bilirubin, Direct 0.0 - 0.2 mg/dL   1.1  STUDIES: MR ABDOMEN MRCP WO CONTRAST  Result Date: 01/25/2022 CLINICAL DATA:  Abdominal pain with cholelithiasis. Biliary duct dilatation on recent ultrasound. EXAM: MRI ABDOMEN WITHOUT CONTRAST  (INCLUDING MRCP) TECHNIQUE: Multiplanar multisequence MR imaging of the abdomen was performed. Heavily T2-weighted images of the biliary and pancreatic ducts were obtained, and three-dimensional MRCP images were rendered by post processing. COMPARISON:  Abdomen/pelvis CT 01/24/2022. Abdominal ultrasound exam 01/24/2022. FINDINGS: Motion degraded study secondary to limitations in reproducible breath holding. Lower chest: No pleural effusion. Hepatobiliary: No suspicious focal abnormality in the liver on this study without intravenous contrast. Multiple gallstones are evident measuring in the 10-11 mm size range (see coronal haste image 14 of series 3). Common bile duct diameter measures up to 18 mm. There is a relatively large tubular shaped filling defect in the common duct/common bile duct measuring 1.1 x 0.9 x 5.1 cm (see coronal T2 image 17 of series 3 and 3D MRCP image 38 of series 17). Review of the previous CT shows increased attenuation in the common duct but no calcification despite calcification of the stones in the gallbladder lumen. Pre contrast T1 imaging of the duct is nondiagnostic due to motion artifact. There is probably at 3-4 mm stone distal to the dominant filling defect near the ampulla. Pancreas: No focal mass lesion. No dilatation of the main duct. No intraparenchymal cyst. No peripancreatic edema. Spleen:  No splenomegaly. No focal mass  lesion. Adrenals/Urinary Tract: No adrenal nodule or mass. Kidneys unremarkable. Stomach/Bowel: Stomach is unremarkable. No gastric wall thickening. No evidence of outlet obstruction. Duodenum is normally positioned as is the ligament of Treitz. No small bowel or colonic dilatation within the visualized abdomen. Colonic diverticulosis evident. Vascular/Lymphatic: No abdominal aortic aneurysm. No abdominal lymphadenopathy. Other:  No intraperitoneal free fluid. Musculoskeletal: No suspicious marrow signal abnormality. IMPRESSION: 1. Motion degraded study secondary to limitations in reproducible breath holding. 2. 1.1 x 0.9 x 5.1 cm tubular shaped filling defect in the common duct/common bile duct. Review of the previous CT shows increased attenuation in the common duct lumen but no calcification despite calcification of the stones in the gallbladder lumen. This tubular structure has a cast-like appearance and may be an unusual noncalcified ductal stone. Intraluminal blood clot would be a consideration although motion degradation on T1 imaging hinders assessment. Given the well-defined margins and low signal intensity on T2 imaging, tumefactive sludge is not considered likely. There may be a 3-4 mm common bile duct stone distal to the dominant filling defect near the ampulla. Consider ERCP to further evaluate. 3. Cholelithiasis. 4. Colonic diverticulosis. Electronically Signed   By: Misty Stanley M.D.   On: 01/25/2022 06:21   MR 3D Recon At Scanner  Result Date: 01/25/2022 CLINICAL DATA:  Abdominal pain with cholelithiasis. Biliary duct dilatation on recent ultrasound. EXAM: MRI ABDOMEN WITHOUT CONTRAST  (INCLUDING MRCP) TECHNIQUE: Multiplanar multisequence MR imaging of the abdomen was performed. Heavily T2-weighted images of the biliary and pancreatic ducts were obtained, and three-dimensional MRCP images were rendered by post processing. COMPARISON:  Abdomen/pelvis CT 01/24/2022. Abdominal ultrasound exam  01/24/2022. FINDINGS: Motion degraded study secondary to limitations in reproducible breath holding. Lower chest: No pleural effusion. Hepatobiliary: No suspicious focal abnormality in the liver on this study without intravenous contrast. Multiple gallstones are evident measuring in the 10-11 mm size range (see coronal haste image 14 of series 3). Common bile duct diameter measures up to 18 mm. There is a relatively large tubular shaped filling defect in the common duct/common bile duct measuring  1.1 x 0.9 x 5.1 cm (see coronal T2 image 17 of series 3 and 3D MRCP image 38 of series 17). Review of the previous CT shows increased attenuation in the common duct but no calcification despite calcification of the stones in the gallbladder lumen. Pre contrast T1 imaging of the duct is nondiagnostic due to motion artifact. There is probably at 3-4 mm stone distal to the dominant filling defect near the ampulla. Pancreas: No focal mass lesion. No dilatation of the main duct. No intraparenchymal cyst. No peripancreatic edema. Spleen:  No splenomegaly. No focal mass lesion. Adrenals/Urinary Tract: No adrenal nodule or mass. Kidneys unremarkable. Stomach/Bowel: Stomach is unremarkable. No gastric wall thickening. No evidence of outlet obstruction. Duodenum is normally positioned as is the ligament of Treitz. No small bowel or colonic dilatation within the visualized abdomen. Colonic diverticulosis evident. Vascular/Lymphatic: No abdominal aortic aneurysm. No abdominal lymphadenopathy. Other:  No intraperitoneal free fluid. Musculoskeletal: No suspicious marrow signal abnormality. IMPRESSION: 1. Motion degraded study secondary to limitations in reproducible breath holding. 2. 1.1 x 0.9 x 5.1 cm tubular shaped filling defect in the common duct/common bile duct. Review of the previous CT shows increased attenuation in the common duct lumen but no calcification despite calcification of the stones in the gallbladder lumen. This  tubular structure has a cast-like appearance and may be an unusual noncalcified ductal stone. Intraluminal blood clot would be a consideration although motion degradation on T1 imaging hinders assessment. Given the well-defined margins and low signal intensity on T2 imaging, tumefactive sludge is not considered likely. There may be a 3-4 mm common bile duct stone distal to the dominant filling defect near the ampulla. Consider ERCP to further evaluate. 3. Cholelithiasis. 4. Colonic diverticulosis. Electronically Signed   By: Misty Stanley M.D.   On: 01/25/2022 06:21   US Abdomen Limited RUQ (LIVER/GB)  Result Date: 01/24/2022 CLINICAL DATA:  Right upper quadrant pain EXAM: ULTRASOUND ABDOMEN LIMITED RIGHT UPPER QUADRANT COMPARISON:  CT 01/24/2022 FINDINGS: Gallbladder: Multiple gallstones. Normal wall thickness. Negative sonographic Murphy. Common bile duct: Diameter: 16 mm Liver: No focal lesion identified. Within normal limits in parenchymal echogenicity. Portal vein is patent on color Doppler imaging with normal direction of blood flow towards the liver. Other: None. IMPRESSION: Cholelithiasis without sonographic evidence for acute cholecystitis. Dilated common bile duct measuring 16 mm, correlate with LFTs and consider correlation with MRCP. Electronically Signed   By: Donavan Foil M.D.   On: 01/24/2022 20:28   CT ABDOMEN PELVIS W CONTRAST  Result Date: 01/24/2022 CLINICAL DATA:  Mid epigastric pain and vomiting. EXAM: CT ABDOMEN AND PELVIS WITH CONTRAST TECHNIQUE: Multidetector CT imaging of the abdomen and pelvis was performed using the standard protocol following bolus administration of intravenous contrast. RADIATION DOSE REDUCTION: This exam was performed according to the departmental dose-optimization program which includes automated exposure control, adjustment of the mA and/or kV according to patient size and/or use of iterative reconstruction technique. CONTRAST:  59m OMNIPAQUE IOHEXOL 300 MG/ML   SOLN COMPARISON:  None Available. FINDINGS: Lower chest: Mild atelectasis is seen within the bilateral lung bases. Hepatobiliary: No focal liver abnormality is seen. Multiple 9 mm and 10 mm gallstones are seen within the dependent portion of a moderately distended gallbladder. There is no evidence of gallbladder wall thickening or pericholecystic inflammation. The common bile duct is dilated and measures 14 mm in diameter. Pancreas: Unremarkable. No pancreatic ductal dilatation or surrounding inflammatory changes. Spleen: Normal in size without focal abnormality. Adrenals/Urinary Tract: Adrenal glands are unremarkable.  Kidneys are normal, without renal calculi, focal lesion, or hydronephrosis. A small amount of air is seen within the lumen of a poorly distended urinary bladder. Stomach/Bowel: Stomach is within normal limits. Appendix appears normal. No evidence of bowel wall thickening, distention, or inflammatory changes. Noninflamed diverticula are seen throughout the large bowel. Vascular/Lymphatic: Aortic atherosclerosis. No enlarged abdominal or pelvic lymph nodes. Reproductive: A 2.2 cm x 2.0 cm x 2.2 cm heterogeneous uterine fibroid is seen along the anterolateral aspect of the uterine fundus on the right. A 0.9 cm x 0.7 cm x 0.8 cm hyperdense focus is seen within the endometrium. The bilateral adnexa are unremarkable. Other: No abdominal wall hernia or abnormality. No abdominopelvic ascites. Musculoskeletal: No acute or significant osseous findings. IMPRESSION: 1. Cholelithiasis. 2. Colonic diverticulosis. 3. Small amount of air within the lumen of a poorly distended urinary bladder. This may be secondary to recent catheterization. Correlation with urinalysis is recommended to exclude the presence of an acute cystitis. 4. Uterine fibroid. 5. Hyperdense focus within the endometrium which may represent a small amount of blood products. Correlation with nonemergent pelvic ultrasound is recommended, as a small  mass cannot be excluded. 6. Aortic atherosclerosis. Aortic Atherosclerosis (ICD10-I70.0). Electronically Signed   By: Virgina Norfolk M.D.   On: 01/24/2022 19:34      Impression / Plan:   Ana Peterson is a 77 y.o. female with history of obesity, hypertension is admitted with epigastric pain radiating to the back with nausea and vomiting, mildly elevated serum lipase, found to have cholelithiasis, dilated CBD to 16 mm, ERCP revealed filling defect in the common bile duct, possible blood clot/stone.  Patient has worsening leukocytosis concerning for ascending cholangitis.  No evidence of gallstone pancreatitis  Recommend to switch to Zosyn from ceftriaxone Maintain n.p.o. Continue IV fluids Discussed with patient regarding ERCP and she is agreeable Recommend general surgery consult for cholecystectomy  I have discussed alternative options, risks & benefits,  which include, but are not limited to, bleeding, infection, perforation,respiratory complication & drug reaction.  The patient agrees with this plan & written consent will be obtained.     Thank you for involving me in the care of this patient.      LOS: 1 day   Sherri Sear, MD  01/25/2022, 11:02 AM    Note: This dictation was prepared with Dragon dictation along with smaller phrase technology. Any transcriptional errors that result from this process are unintentional.

## 2022-01-25 NOTE — Progress Notes (Signed)
Mobility Specialist - Progress Note   01/25/22 0958  Mobility  Activity Ambulated independently to bathroom  Level of Assistance Modified independent, requires aide device or extra time  Assistive Device None  Distance Ambulated (ft) 8 ft  Activity Response Tolerated well  Mobility Referral Yes  $Mobility charge 1 Mobility   Pt supine upon entry, utilizing RA. Pt completed bed mob, STS and amb ModI. Pt ambulated to the bathroom without AD, tolerated well. Pt left sitting on commode with education on how to call out when finished.   Candie Mile Mobility Specialist 01/25/22 10:01 AM

## 2022-01-25 NOTE — Transfer of Care (Signed)
Immediate Anesthesia Transfer of Care Note  Patient: Ana Peterson  Procedure(s) Performed: ENDOSCOPIC RETROGRADE CHOLANGIOPANCREATOGRAPHY (ERCP)  Patient Location: PACU  Anesthesia Type:General  Level of Consciousness: awake and alert   Airway & Oxygen Therapy: Patient Spontanous Breathing  Post-op Assessment: Report given to RN and Post -op Vital signs reviewed and stable  Post vital signs: Reviewed and stable  Last Vitals:  Vitals Value Taken Time  BP    Temp    Pulse    Resp    SpO2      Last Pain:  Vitals:   01/25/22 1106  TempSrc: Temporal  PainSc: 0-No pain         Complications: No notable events documented.

## 2022-01-25 NOTE — Progress Notes (Signed)
Pt arrived to unit via bed, A&O x4, spanish speaking, interpreter services used to communicate w/pt, denies pain, some nausea noted but improved per pt, pt educated on how to use call bell/button, TV remote and safety protocols, pt verbalized understanding, pt resting at this time, lights dimmed, bed in lowest position, no signs of distress, will monitor closely

## 2022-01-25 NOTE — Progress Notes (Signed)
Mobility Specialist - Progress Note   01/25/22 1002  Mobility  Activity Ambulated independently in room  Level of Assistance Modified independent, requires aide device or extra time  Assistive Device None  Distance Ambulated (ft) 8 ft  Activity Response Tolerated well  $Mobility charge 1 Mobility   Pt in bathroom upon entry, utilizing RA. Pt ambulated from the bathroom, returned to bed with alarm set and needs within reach.   Candie Mile Mobility Specialist 01/25/22 10:08 AM

## 2022-01-25 NOTE — Op Note (Signed)
[facility] Gastroenterology Patient Name: Ana Peterson Procedure Date: 01/25/2022 11:24 AM MRN: [id number] Account #: [id number] Date of Birth: 1945-06-27 Admit Type: Outpatient Age: 77 Room: [facility] ENDO ROOM 4 Gender: Female Note Status: Finalized Instrument Name: Nolon Stalls [id number] Procedure:             ERCP Indications:           Common bile duct stone(s) Providers:             Midge Minium MD, MD Medicines:             Propofol per Anesthesia Complications:         No immediate complications. Procedure:             Pre-Anesthesia Assessment:                        - Prior to the procedure, a History and Physical was                         performed, and patient medications and allergies were                         reviewed. The patient's tolerance of previous                         anesthesia was also reviewed. The risks and benefits                         of the procedure and the sedation options and risks                         were discussed with the patient. All questions were                         answered, and informed consent was obtained. Prior                         Anticoagulants: The patient has taken no anticoagulant                         or antiplatelet agents. ASA Grade Assessment: II - A                         patient with mild systemic disease. After reviewing                         the risks and benefits, the patient was deemed in                         satisfactory condition to undergo the procedure.                        After obtaining informed consent, the scope was passed                         under direct vision. Throughout the procedure, the                         patient's blood pressure,  pulse, and oxygen                         saturations were monitored continuously. The                         Duodenoscope was introduced through the mouth, and                         used to inject contrast into and used to  cannulate the                         bile duct. The ERCP was accomplished without                         difficulty. The patient tolerated the procedure well. Findings:      The scout film was normal. The esophagus was successfully intubated       under direct vision. The scope was advanced to a normal major papilla in       the descending duodenum without detailed examination of the pharynx,       larynx and associated structures, and upper GI tract. The upper GI tract       was grossly normal. The bile duct was deeply cannulated with the       short-nosed traction sphincterotome. Contrast was injected. I personally       interpreted the bile duct images. There was brisk flow of contrast       through the ducts. Image quality was excellent. Contrast extended to the       entire biliary tree. The lower third of the main bile duct contained       filling defect(s). A wire was passed into the biliary tree. A 7 mm       biliary sphincterotomy was made with a traction (standard)       sphincterotome using ERBE electrocautery. There was no       post-sphincterotomy bleeding. The biliary tree was swept with a 15 mm       balloon starting at the bifurcation. Sludge was swept from the duct. A       few stones were removed. No stones remained. Impression:            - A filling defect was seen on the cholangiogram.                        - Choledocholithiasis was found. Complete removal was                         accomplished by biliary sphincterotomy and balloon                         extraction.                        - A biliary sphincterotomy was performed.                        - The biliary tree was swept. Recommendation:        - Return patient to hospital ward for ongoing care.                        -  Clear liquid diet today.                        - Continue present medications.                        - Watch for pancreatitis, bleeding, perforation, and                          cholangitis.                        - Advance diet tomorrow if no signs of pancreatitis Procedure Code(s):     --- Professional ---                        [phone], Endoscopic retrograde cholangiopancreatography                         (ERCP); with removal of calculi/debris from                         biliary/pancreatic duct(s)                        [id number], Endoscopic retrograde cholangiopancreatography                         (ERCP); with sphincterotomy/papillotomy                        [phone], Endoscopic catheterization of the biliary                         ductal system, radiological supervision and                         interpretation Diagnosis Code(s):     --- Professional ---                        K80.50, Calculus of bile duct without cholangitis or                         cholecystitis without obstruction CPT copyright 2022 American Medical Association. All rights reserved. The codes documented in this report are preliminary and upon coder review may  be revised to meet current compliance requirements. Lucilla Lame MD, MD 01/25/2022 12:09:34 PM This report has been signed electronically. Number of Addenda: 0 Note Initiated On: 01/25/2022 11:24 AM Estimated Blood Loss:  Estimated blood loss: none.      [facility]

## 2022-01-25 NOTE — Consult Note (Signed)
NAME: Ana Peterson  DOB: 04/16/1945  MRN: [id number]  Date/Time: 01/25/2022 3:04 PM  REQUESTING PROVIDER: Dr.Kumar Subjective:  REASON FOR CONSULT: ESBL ecoli bacteremia history obtained thru [employer] video interpreting service Ana Peterson is a 77 y.o. female who speaks spanish with a history of HTN presented to the ED on 01/24/22 with abdominal pain of few hours along with nausea and vomiting. The pain was in the upper abdomen radiating to the back. She did not have any fever or chills In the ED vitals were 126/62, HR 93, Temp 98.4 Labs showed AST 624, ALT 239, TB 1.8, cr 1.36 CT abdomen showed gall stones and distended gall bladder. Blood culture sent- as she was c/o dysuria UC was sent. Was started on ceftriaxone MRCP revealed CBD dilatation of 61mm, and filling defect in CBD and multiple gall stones- She underwent ERCP and , biliary sphincterotomy and stone extraction from CBD today Blood culture is positive for ESBL ecoli and I am seeing the patient for the same  Past Medical History:  Diagnosis Date   Edema    legs/feet   Hypertension    Syncope    Vertigo     Past Surgical History:  Procedure Laterality Date   CATARACT EXTRACTION W/PHACO Right 07/22/2014   Procedure: CATARACT EXTRACTION PHACO AND INTRAOCULAR LENS PLACEMENT (IOC);  Surgeon: Galen Manila, MD;  Location: [facility] ORS;  Service: Ophthalmology;  Laterality: Right;  [place]   1:02.7 AP%  25.3 CDE  15.88 fluid pack lot # [id number]   EYE SURGERY      Social History   Socioeconomic History   Marital status: Married    Spouse name: Not on file   Number of children: Not on file   Years of education: Not on file   Highest education level: Not on file  Occupational History   Not on file  Tobacco Use   Smoking status: Never    Passive exposure: Never   Smokeless tobacco: Never  Vaping Use   Vaping Use: Never used  Substance and Sexual Activity   Alcohol use: Not on file   Drug use: Not on file   Sexual  activity: Not on file  Other Topics Concern   Not on file  Social History Narrative   Not on file   Social Determinants of Health   Financial Resource Strain: Not on file  Food Insecurity: No Food Insecurity (01/25/2022)   Hunger Vital Sign    Worried About Running Out of Food in the Last Year: Never true    Ran Out of Food in the Last Year: Never true  Transportation Needs: No Transportation Needs (01/25/2022)   PRAPARE - Administrator, Civil Service (Medical): No    Lack of Transportation (Non-Medical): No  Physical Activity: Not on file  Stress: Not on file  Social Connections: Not on file  Intimate Partner Violence: Not At Risk (01/25/2022)   Humiliation, Afraid, Rape, and Kick questionnaire    Fear of Current or Ex-Partner: No    Emotionally Abused: No    Physically Abused: No    Sexually Abused: No    Family History  Problem Relation Age of Onset   Breast cancer Neg Hx    No Known Allergies I? Current Facility-Administered Medications  Medication Dose Route Frequency Provider Last Rate Last Admin   0.9 %  sodium chloride infusion   Intravenous Continuous Acheampong, Genice Rouge, MD 125 mL/hr at 01/25/22 1337 New Bag at 01/25/22 1337   acetaminophen (TYLENOL) tablet  650 mg  650 mg Oral Q6H PRN Val Riles, MD       albuterol (PROVENTIL) (2.5 MG/3ML) 0.083% nebulizer solution 2.5 mg  2.5 mg Nebulization Q2H PRN Acheampong, Warnell Bureau, MD       diclofenac 100 suppository            enoxaparin (LOVENOX) injection 40 mg  40 mg Subcutaneous Q24H Acheampong, Warnell Bureau, MD   40 mg at 01/25/22 4970   hydrALAZINE (APRESOLINE) injection 5 mg  5 mg Intravenous Q6H PRN Acheampong, Warnell Bureau, MD       meropenem (MERREM) 1 g in sodium chloride 0.9 % 100 mL IVPB  1 g Intravenous Q12H Val Riles, MD 200 mL/hr at 01/25/22 1338 1 g at 01/25/22 1338   morphine (PF) 2 MG/ML injection 2 mg  2 mg Intravenous Q4H PRN Acheampong, Warnell Bureau, MD       ondansetron [facility]) injection 4 mg  4 mg  Intravenous Q6H PRN Sharion Settler, NP   4 mg at 01/25/22 0224   sodium chloride flush (NS) 0.9 % injection 3 mL  3 mL Intravenous Q12H Artist Beach, MD   3 mL at 01/25/22 2637     Abtx:  Anti-infectives (From admission, onward)    Start     Dose/Rate Route Frequency Ordered Stop   01/25/22 1900  cefTRIAXone (ROCEPHIN) 2 g in sodium chloride 0.9 % 100 mL IVPB  Status:  Discontinued        2 g 200 mL/hr over 30 Minutes Intravenous Every 24 hours 01/24/22 2318 01/25/22 0956   01/25/22 1300  meropenem (MERREM) 1 g in sodium chloride 0.9 % 100 mL IVPB        1 g 200 mL/hr over 30 Minutes Intravenous Every 12 hours 01/25/22 1109     01/25/22 1000  piperacillin-tazobactam (ZOSYN) IVPB 3.375 g  Status:  Discontinued        3.375 g 12.5 mL/hr over 240 Minutes Intravenous Every 8 hours 01/25/22 0956 01/25/22 1107   01/24/22 2330  cefTRIAXone (ROCEPHIN) 1 g in sodium chloride 0.9 % 100 mL IVPB        1 g 200 mL/hr over 30 Minutes Intravenous  Once 01/24/22 2322 01/25/22 0056   01/24/22 1900  cefTRIAXone (ROCEPHIN) 1 g in sodium chloride 0.9 % 100 mL IVPB        1 g 200 mL/hr over 30 Minutes Intravenous  Once 01/24/22 1847 01/24/22 2017       REVIEW OF SYSTEMS:  Const: low grade fever, negative chills, negative weight loss Eyes: negative diplopia or visual changes, negative eye pain ENT: negative coryza, negative sore throat Resp: negative cough, hemoptysis, dyspnea Cards: negative for chest pain, palpitations, lower extremity edema GU:had dysuria but none now GI: Negative for abdominal pain, diarrhea, bleeding, constipation Skin: negative for rash and pruritus Heme: negative for easy bruising and gum/nose bleeding MS: weakness Neurolo:negative for headaches, dizziness, vertigo, memory problems  Psych:  anxiety  Endocrine: negative for thyroid, diabetes Allergy/Immunology- negative for any medication or food allergies ?  Objective:  VITALS:  BP (!) 114/56 (BP Location:  Left Arm)   Pulse 76   Temp 98.5 F (36.9 C)   Resp 18   Ht 5\' 5"  (1.651 m)   Wt 74.8 kg   SpO2 96%   BMI 27.46 kg/m   PHYSICAL EXAM:  General: Alert, cooperative, no distress, appears stated age.  Head: Normocephalic, without obvious abnormality, atraumatic. Eyes: Conjunctivae clear, anicteric sclerae. Pupils are  equal ENT Nares normal. No drainage or sinus tenderness. Lips, mucosa, and tongue normal. No Thrush Neck: Supple, symmetrical, no adenopathy, thyroid: non tender no carotid bruit and no JVD. Back: No CVA tenderness. Lungs: Clear to auscultation bilaterally. No Wheezing or Rhonchi. No rales. Heart: Regular rate and rhythm, no murmur, rub or gallop. Abdomen: Soft, non-tender,not distended. Bowel sounds normal. No masses Extremities: atraumatic, no cyanosis. No edema. No clubbing Skin: No rashes or lesions. Or bruising Lymph: Cervical, supraclavicular normal. Neurologic: Grossly non-focal Pertinent Labs Lab Results CBC    Component Value Date/Time   WBC 20.1 (H) 01/25/2022 0515   RBC 2.84 (L) 01/25/2022 0515   HGB 9.0 (L) 01/25/2022 0515   HCT 27.3 (L) 01/25/2022 0515   PLT 152 01/25/2022 0515   MCV 96.1 01/25/2022 0515   MCH 31.7 01/25/2022 0515   MCHC 33.0 01/25/2022 0515   RDW 12.0 01/25/2022 0515   LYMPHSABS 0.3 (L) 01/24/2022 1826   MONOABS 0.1 01/24/2022 1826   EOSABS 0.0 01/24/2022 1826   BASOSABS 0.0 01/24/2022 1826       Latest Ref Rng & Units 01/25/2022    5:15 AM 01/24/2022    6:26 PM 01/24/2022    4:45 PM  CMP  Glucose 70 - 99 mg/dL 329  924    BUN 8 - 23 mg/dL 25  28    Creatinine 2.68 - 1.00 mg/dL 3.41  9.62    Sodium 229 - 145 mmol/L 139  137    Potassium 3.5 - 5.1 mmol/L 4.6  4.2    Chloride 98 - 111 mmol/L 113  105    CO2 22 - 32 mmol/L 21  19    Calcium 8.9 - 10.3 mg/dL 8.0  9.2    Total Protein 6.5 - 8.1 g/dL 6.1  7.5    Total Bilirubin 0.3 - 1.2 mg/dL 2.2  1.8  1.8   Alkaline Phos 38 - 126 U/L 59  80    AST 15 - 41 U/L 290  624     ALT 0 - 44 U/L 207  239        Microbiology: Recent Results (from the past 240 hour(s))  Resp panel by RT-PCR (RSV, Flu A&B, Covid) Anterior Nasal Swab     Status: None   Collection Time: 01/24/22  4:50 PM   Specimen: Anterior Nasal Swab  Result Value Ref Range Status   SARS Coronavirus 2 by RT PCR NEGATIVE NEGATIVE Final    Comment: (NOTE) SARS-CoV-2 target nucleic acids are NOT DETECTED.  The SARS-CoV-2 RNA is generally detectable in upper respiratory specimens during the acute phase of infection. The lowest concentration of SARS-CoV-2 viral copies this assay can detect is 138 copies/mL. A negative result does not preclude SARS-Cov-2 infection and should not be used as the sole basis for treatment or other patient management decisions. A negative result may occur with  improper specimen collection/handling, submission of specimen other than nasopharyngeal swab, presence of viral mutation(s) within the areas targeted by this assay, and inadequate number of viral copies(<138 copies/mL). A negative result must be combined with clinical observations, patient history, and epidemiological information. The expected result is Negative.  Fact Sheet for Patients:  BloggerCourse.com  Fact Sheet for Healthcare Providers:  SeriousBroker.it  This test is no t yet approved or cleared by the [place] FDA and  has been authorized for detection and/or diagnosis of SARS-CoV-2 by FDA under an Emergency Use Authorization (EUA). This EUA will remain  in effect (meaning this  test can be used) for the duration of the COVID-19 declaration under Section 564(b)(1) of the Act, 21 U.S.C.section 360bbb-3(b)(1), unless the authorization is terminated  or revoked sooner.       Influenza A by PCR NEGATIVE NEGATIVE Final   Influenza B by PCR NEGATIVE NEGATIVE Final    Comment: (NOTE) The Xpert Xpress SARS-CoV-2/FLU/RSV plus assay is intended as  an aid in the diagnosis of influenza from Nasopharyngeal swab specimens and should not be used as a sole basis for treatment. Nasal washings and aspirates are unacceptable for Xpert Xpress SARS-CoV-2/FLU/RSV testing.  Fact Sheet for Patients: EntrepreneurPulse.com.au  Fact Sheet for Healthcare Providers: IncredibleEmployment.be  This test is not yet approved or cleared by the [place] FDA and has been authorized for detection and/or diagnosis of SARS-CoV-2 by FDA under an Emergency Use Authorization (EUA). This EUA will remain in effect (meaning this test can be used) for the duration of the COVID-19 declaration under Section 564(b)(1) of the Act, 21 U.S.C. section 360bbb-3(b)(1), unless the authorization is terminated or revoked.     Resp Syncytial Virus by PCR NEGATIVE NEGATIVE Final    Comment: (NOTE) Fact Sheet for Patients: EntrepreneurPulse.com.au  Fact Sheet for Healthcare Providers: IncredibleEmployment.be  This test is not yet approved or cleared by the [place] FDA and has been authorized for detection and/or diagnosis of SARS-CoV-2 by FDA under an Emergency Use Authorization (EUA). This EUA will remain in effect (meaning this test can be used) for the duration of the COVID-19 declaration under Section 564(b)(1) of the Act, 21 U.S.C. section 360bbb-3(b)(1), unless the authorization is terminated or revoked.  Performed at [facility], [address] [place], [place] [address]   Blood culture (routine x 2)     Status: None (Preliminary result)   Collection Time: 01/24/22  8:22 PM   Specimen: BLOOD  Result Value Ref Range Status   Specimen Description BLOOD RIGHT ASSIST CONTROL  Final   Special Requests   Final    BOTTLES DRAWN AEROBIC AND ANAEROBIC Blood Culture results may not be optimal due to an excessive volume of blood received in culture bottles   Culture   Final     NO GROWTH < 12 HOURS Performed at [facility], [address] [place], [place] [address]    Report Status PENDING  Incomplete  Blood culture (routine x 2)     Status: None (Preliminary result)   Collection Time: 01/24/22  8:36 PM   Specimen: BLOOD  Result Value Ref Range Status   Specimen Description BLOOD LEFT ASSIST CONTROL  Final   Special Requests   Final    BOTTLES DRAWN AEROBIC AND ANAEROBIC Blood Culture adequate volume   Culture  Setup Time   Final    GRAM NEGATIVE RODS ANAEROBIC BOTTLE ONLY Organism ID to follow Performed at [facility], [address] [place], [place] [address]    Culture GRAM NEGATIVE RODS  Final   Report Status PENDING  Incomplete  Blood Culture ID Panel (Reflexed)     Status: Abnormal   Collection Time: 01/24/22  8:36 PM  Result Value Ref Range Status   Enterococcus faecalis NOT DETECTED NOT DETECTED Final   Enterococcus Faecium NOT DETECTED NOT DETECTED Final   Listeria monocytogenes NOT DETECTED NOT DETECTED Final   Staphylococcus species NOT DETECTED NOT DETECTED Final   Staphylococcus aureus (BCID) NOT DETECTED NOT DETECTED Final   Staphylococcus epidermidis NOT DETECTED NOT DETECTED Final   Staphylococcus lugdunensis NOT DETECTED NOT  DETECTED Final   Streptococcus species NOT DETECTED NOT DETECTED Final   Streptococcus agalactiae NOT DETECTED NOT DETECTED Final   Streptococcus pneumoniae NOT DETECTED NOT DETECTED Final   Streptococcus pyogenes NOT DETECTED NOT DETECTED Final   A.calcoaceticus-baumannii NOT DETECTED NOT DETECTED Final   Bacteroides fragilis NOT DETECTED NOT DETECTED Final   Enterobacterales DETECTED (A) NOT DETECTED Final    Comment: Enterobacterales represent a large order of gram negative bacteria, not a single organism. CRITICAL RESULT CALLED TO, READ BACK BY AND VERIFIED WITH: KRISTIN MERRILL PHARMD 1045 01/25/22 HNM    Enterobacter cloacae complex NOT DETECTED NOT DETECTED Final   Escherichia coli  DETECTED (A) NOT DETECTED Final    Comment: CRITICAL RESULT CALLED TO, READ BACK BY AND VERIFIED WITH: KRISTIN MERRILL PHARMD 1045 01/25/22 HNM    Klebsiella aerogenes NOT DETECTED NOT DETECTED Final   Klebsiella oxytoca NOT DETECTED NOT DETECTED Final   Klebsiella pneumoniae NOT DETECTED NOT DETECTED Final   Proteus species NOT DETECTED NOT DETECTED Final   Salmonella species NOT DETECTED NOT DETECTED Final   Serratia marcescens NOT DETECTED NOT DETECTED Final   Haemophilus influenzae NOT DETECTED NOT DETECTED Final   Neisseria meningitidis NOT DETECTED NOT DETECTED Final   Pseudomonas aeruginosa NOT DETECTED NOT DETECTED Final   Stenotrophomonas maltophilia NOT DETECTED NOT DETECTED Final   Candida albicans NOT DETECTED NOT DETECTED Final   Candida auris NOT DETECTED NOT DETECTED Final   Candida glabrata NOT DETECTED NOT DETECTED Final   Candida krusei NOT DETECTED NOT DETECTED Final   Candida parapsilosis NOT DETECTED NOT DETECTED Final   Candida tropicalis NOT DETECTED NOT DETECTED Final   Cryptococcus neoformans/gattii NOT DETECTED NOT DETECTED Final   CTX-M ESBL DETECTED (A) NOT DETECTED Final    Comment: CRITICAL RESULT CALLED TO, READ BACK BY AND VERIFIED WITH: KRISTIN MERRILL PHARMD 1045 01/25/22 HNM (NOTE) Extended spectrum beta-lactamase detected. Recommend a carbapenem as initial therapy.      Carbapenem resistance IMP NOT DETECTED NOT DETECTED Final   Carbapenem resistance KPC NOT DETECTED NOT DETECTED Final   Carbapenem resistance NDM NOT DETECTED NOT DETECTED Final   Carbapenem resist OXA 48 LIKE NOT DETECTED NOT DETECTED Final   Carbapenem resistance VIM NOT DETECTED NOT DETECTED Final    Comment: Performed at [facility], [address] [place], [place] [address]    IMAGING RESULTS: MRCP Dilated CBD, distended gall bladder Gall stones I have personally reviewed the films ? Impression/Recommendation Cholelithiasis with CBD obstruction with likely  ascending cholangitis - underwent ERCP , biliary sphincterotomy and stone removal ESBL ecoli bacteremia  due to the above Started meropenem and ceftriaxone was Discontinued Will repeat Blood culture to make sure no bacteremia before we can place midline/PICC She will need IV antibiotic for 10 days  ? ? ___________________________________________________ Discussed with patient, requesting provider Note:  This document was prepared using Dragon voice recognition software and may include unintentional dictation errors.

## 2022-01-25 NOTE — Anesthesia Procedure Notes (Signed)
Date/Time: 01/25/2022 11:46 AM  Performed by: Demetrius Charity, CRNAPre-anesthesia Checklist: Patient identified, Emergency Drugs available, Suction available, Patient being monitored and Timeout performed Patient Re-evaluated:Patient Re-evaluated prior to induction Oxygen Delivery Method: Nasal cannula Induction Type: IV induction Placement Confirmation: CO2 detector and positive ETCO2

## 2022-01-26 ENCOUNTER — Encounter: Payer: Self-pay | Admitting: Gastroenterology

## 2022-01-26 DIAGNOSIS — A498 Other bacterial infections of unspecified site: Secondary | ICD-10-CM

## 2022-01-26 DIAGNOSIS — B9620 Unspecified Escherichia coli [E. coli] as the cause of diseases classified elsewhere: Secondary | ICD-10-CM | POA: Diagnosis not present

## 2022-01-26 DIAGNOSIS — K8309 Other cholangitis: Secondary | ICD-10-CM

## 2022-01-26 DIAGNOSIS — K8070 Calculus of gallbladder and bile duct without cholecystitis without obstruction: Secondary | ICD-10-CM | POA: Diagnosis not present

## 2022-01-26 DIAGNOSIS — R7881 Bacteremia: Secondary | ICD-10-CM

## 2022-01-26 DIAGNOSIS — K8050 Calculus of bile duct without cholangitis or cholecystitis without obstruction: Secondary | ICD-10-CM | POA: Diagnosis not present

## 2022-01-26 DIAGNOSIS — K851 Biliary acute pancreatitis without necrosis or infection: Secondary | ICD-10-CM | POA: Diagnosis not present

## 2022-01-26 DIAGNOSIS — Z1612 Extended spectrum beta lactamase (ESBL) resistance: Secondary | ICD-10-CM | POA: Diagnosis not present

## 2022-01-26 LAB — BASIC METABOLIC PANEL
Anion gap: 8 (ref 5–15)
BUN: 27 mg/dL — ABNORMAL HIGH (ref 8–23)
CO2: 17 mmol/L — ABNORMAL LOW (ref 22–32)
Calcium: 7.7 mg/dL — ABNORMAL LOW (ref 8.9–10.3)
Chloride: 117 mmol/L — ABNORMAL HIGH (ref 98–111)
Creatinine, Ser: 1.34 mg/dL — ABNORMAL HIGH (ref 0.44–1.00)
GFR, Estimated: 41 mL/min — ABNORMAL LOW (ref >60)
Glucose, Bld: 89 mg/dL (ref 70–99)
Potassium: 4.2 mmol/L (ref 3.5–5.1)
Sodium: 142 mmol/L (ref 135–145)

## 2022-01-26 LAB — HEMOGLOBIN A1C
Hgb A1c MFr Bld: 6.2 % — ABNORMAL HIGH (ref 4.8–5.6)
Mean Plasma Glucose: 131 mg/dL

## 2022-01-26 LAB — HEPATIC FUNCTION PANEL
ALT: 133 U/L — ABNORMAL HIGH (ref 0–44)
AST: 108 U/L — ABNORMAL HIGH (ref 15–41)
Albumin: 2.8 g/dL — ABNORMAL LOW (ref 3.5–5.0)
Alkaline Phosphatase: 66 U/L (ref 38–126)
Bilirubin, Direct: 0.9 mg/dL — ABNORMAL HIGH (ref 0.0–0.2)
Indirect Bilirubin: 0.6 mg/dL (ref 0.3–0.9)
Total Bilirubin: 1.5 mg/dL — ABNORMAL HIGH (ref 0.3–1.2)
Total Protein: 5.8 g/dL — ABNORMAL LOW (ref 6.5–8.1)

## 2022-01-26 LAB — GLUCOSE, CAPILLARY
Glucose-Capillary: 88 mg/dL (ref 70–99)
Glucose-Capillary: 108 mg/dL — ABNORMAL HIGH (ref 70–99)
Glucose-Capillary: 90 mg/dL (ref 70–99)
Glucose-Capillary: 102 mg/dL — ABNORMAL HIGH (ref 70–99)

## 2022-01-26 LAB — CBC
HCT: 26.5 % — ABNORMAL LOW (ref 36.0–46.0)
Hemoglobin: 8.6 g/dL — ABNORMAL LOW (ref 12.0–15.0)
MCH: 32.1 pg (ref 26.0–34.0)
MCHC: 32.5 g/dL (ref 30.0–36.0)
MCV: 98.9 fL (ref 80.0–100.0)
Platelets: 127 10*3/uL — ABNORMAL LOW (ref 150–400)
RBC: 2.68 MIL/uL — ABNORMAL LOW (ref 3.87–5.11)
RDW: 12.4 % (ref 11.5–15.5)
WBC: 9.2 10*3/uL (ref 4.0–10.5)
nRBC: 0 % (ref 0.0–0.2)

## 2022-01-26 LAB — URINE CULTURE

## 2022-01-26 LAB — IRON AND TIBC
Iron: 44 ug/dL (ref 28–170)
Saturation Ratios: 21 % (ref 10.4–31.8)
TIBC: 207 ug/dL — ABNORMAL LOW (ref 250–450)
UIBC: 163 ug/dL

## 2022-01-26 LAB — FOLATE: Folate: 6.5 ng/mL (ref >5.9)

## 2022-01-26 LAB — MAGNESIUM: Magnesium: 1.7 mg/dL (ref 1.7–2.4)

## 2022-01-26 LAB — PHOSPHORUS: Phosphorus: 2.4 mg/dL — ABNORMAL LOW (ref 2.5–4.6)

## 2022-01-26 LAB — LIPASE, BLOOD: Lipase: 34 U/L (ref 11–51)

## 2022-01-26 MED ORDER — K PHOS MONO-SOD PHOS DI & MONO 155-852-130 MG PO TABS
500.0000 mg | ORAL_TABLET | Freq: Three times a day (TID) | ORAL | Status: AC
  Administered 2022-01-26 (×3): 500 mg via ORAL
  Filled 2022-01-26 (×3): qty 2

## 2022-01-26 MED ORDER — INDOCYANINE GREEN 25 MG IV SOLR
1.2500 mg | Freq: Once | INTRAVENOUS | Status: DC
  Filled 2022-01-26: qty 10

## 2022-01-26 MED ORDER — INDOCYANINE GREEN 25 MG IV SOLR
1.2500 mg | Freq: Once | INTRAVENOUS | Status: AC
  Administered 2022-01-27: 1.25 mg via INTRAVENOUS
  Filled 2022-01-26: qty 10

## 2022-01-26 MED ORDER — SODIUM BICARBONATE 650 MG PO TABS
650.0000 mg | ORAL_TABLET | Freq: Three times a day (TID) | ORAL | Status: DC
  Administered 2022-01-26 – 2022-01-28 (×7): 650 mg via ORAL
  Filled 2022-01-26 (×7): qty 1

## 2022-01-26 NOTE — Progress Notes (Signed)
[facility] Progress Note  Patient: Ana Peterson    HUD:[id number]  DOA: 01/24/2022     Date of Service: the patient was seen and examined on 01/26/2022  Chief Complaint  Patient presents with   Abdominal Pain   Brief hospital course: Ana Peterson is a 77 y.o. female with medical history significant of Hypertension and obesity.  She presented to the emergency room on account of severe abdominal pain, associated with nausea and vomiting. In the ED, workup including CT scan of the abdomen pelvis were done. Showed evidence of cholelithiasis. Common bile duct was noted to be dilated to 14 mm. She was also noted to have evidence of elevated transaminases. Based on findings, this was concerning for obstructive biliary disease. The ED did discuss case with Dr. Louretta Parma of gastroenterology who recommended MRCP and fractionated bilirubin test. Based upon findings of MRCP, GI team will reevaluate patient for ERCP. Patient was referred to hospitalist service for admission.    Assessment and Plan:  Choledocholithiasis and cholelithiasis Patient presented with abdominal pain, nausea and vomiting, resolved  MRCP positive for CBD filling defect, recommended ERCP GI was consulted, ERCP was done and CBD stone was removed. LFTs improving Started clear liquid diet, advance as per tolerance tomorrow a.m. Watch for any signs of pancreatitis Continue symptomatic treatment for pain control Continue IV fluid for hydration GI following.   E. coli ESBL bacteremia due to cholangitis Sepsis due to E. coli ESBL bacteremia Patient was hypotensive and elevated lactic acid level, leukocytosis S/p ceftriaxone which was discontinued and started on meropenem 1 g IV every 12 hours ID consulted Follow repeat blood cultures tomorrow a.m.  Cholangitis Continue IV antibiotics General surgery consulted, plan for lap chole tomorrow a.m. Keep n.p.o. after midnight  Metabolic acidosis, CO2 17 Started oral  bicarbonate supplement. Slightly elevated creatinine could be due to dehydration, continue IV fluid   Hypophosphatemia, mild, phosphorus repleted orally.    Acute pancreatitis due to CBD stone Continue IV fluid for hydration Lipase trended down   Hypertension, blood pressure remained soft Patient was on lisinopril and HCTZ prn at home which has been held for now Continue to monitor BP Use IV hydralazine as needed if blood pressure remains high   Mild hyperglycemia Check hemoglobin A1c  Anemia of chronic disease, follow anemia workup Monitor H&H   Body mass index is 27.46 kg/m.  Interventions:    Diet: Clear liquid diet DVT Prophylaxis: Subcutaneous Lovenox   Advance goals of care discussion: Full code  Family Communication: family was present at bedside, at the time of interview.  The pt provided permission to discuss medical plan with the family. Opportunity was given to ask question and all questions were answered satisfactorily.   Disposition:  Pt is from Home, admitted with Abd pain due to CBD stone, s/p ERCP, E. coli ESBL bacteremia, still on IV antibiotics, pending repeat blood cultures, ID consulted , which precludes a safe discharge. Discharge to home, when stable and cleared by GI and ID, IV antibiotics. General surgery consulted, plan for lap chole tomorrow a.m. on 1/4  Subjective: No significant overnight events, abdominal pain resolved, no nausea vomiting.  Patient is asking when she can go home.  Patient was advised to stay for possible lap chole and also she will need IV antibiotics, it may take 1-2 more days to make the final decision. Spanish interpreter was used for communication.  Physical Exam: General:  NAD, AAO x 3 Appear in no distress, affect appropriate Eyes: PERRLA ENT:  Oral Mucosa Clear, moist  Neck: no JVD,  Cardiovascular: S1 and S2 Present, no Murmur,  Respiratory: good respiratory effort, Bilateral Air entry equal and Decreased, no  Crackles, no wheezes Abdomen: Bowel Sound present, Soft and no tenderness,  Skin: no rashes Extremities: no Pedal edema, no calf tenderness Neurologic: without any new focal findings Gait not checked due to patient safety concerns  Vitals:   01/26/22 0427 01/26/22 0516 01/26/22 0748 01/26/22 1513  BP: 123/61 130/62 (!) 111/52 (!) 144/76  Pulse: 89 82 70 87  Resp: 18 20 17 18   Temp: 98.4 F (36.9 C) 98.4 F (36.9 C) 98.7 F (37.1 C) 98.8 F (37.1 C)  TempSrc:  Oral Oral Oral  SpO2: 94% 93% 97% 97%  Weight:      Height:        Intake/Output Summary (Last 24 hours) at 01/26/2022 1610 Last data filed at 01/26/2022 1559 Gross per 24 hour  Intake 460 ml  Output --  Net 460 ml   Filed Weights   01/25/22 0340  Weight: 74.8 kg    Data Reviewed: I have personally reviewed and interpreted daily labs, tele strips, imagings as discussed above. I reviewed all nursing notes, pharmacy notes, vitals, pertinent old records I have discussed plan of care as described above with RN and patient/family.  CBC: Recent Labs  Lab 01/24/22 1826 01/25/22 0515 01/26/22 0448  WBC 11.0* 20.1* 9.2  NEUTROABS 10.6*  --   --   HGB 11.5* 9.0* 8.6*  HCT 35.0* 27.3* 26.5*  MCV 96.7 96.1 98.9  PLT 173 152 938*   Basic Metabolic Panel: Recent Labs  Lab 01/24/22 1826 01/25/22 0515 01/26/22 0448  NA 137 139 142  K 4.2 4.6 4.2  CL 105 113* 117*  CO2 19* 21* 17*  GLUCOSE 144* 146* 89  BUN 28* 25* 27*  CREATININE 1.36* 1.17* 1.34*  CALCIUM 9.2 8.0* 7.7*  MG  --   --  1.7  PHOS  --   --  2.4*    Studies: No results found.  Scheduled Meds:  enoxaparin (LOVENOX) injection  40 mg Subcutaneous Q24H   indocyanine green  1.25 mg Intravenous Once   phosphorus  500 mg Oral TID   sodium bicarbonate  650 mg Oral TID   sodium chloride flush  3 mL Intravenous Q12H   Continuous Infusions:  sodium chloride 75 mL/hr at 01/26/22 1559   meropenem (MERREM) IV Stopped (01/26/22 1310)   PRN Meds:  acetaminophen, albuterol, hydrALAZINE, morphine injection, ondansetron (ZOFRAN) IV  Time spent: 50 minutes  Author: Val Riles. MD [facility] 01/26/2022 4:10 PM  To reach On-call, see care teams to locate the attending and reach out to them via [email or web address]. If 7PM-7AM, please contact night-coverage If you still have difficulty reaching the attending provider, please page the [facility] (Director on Call) for [facility] on amion for assistance.

## 2022-01-26 NOTE — TOC Initial Note (Signed)
Transition of Care [facility]) - Initial/Assessment Note    Patient Details  Name: Ana Peterson MRN: [id number] Date of Birth: [birth year]  Transition of [facility]) CM/SW Contact:    Beverly Sessions, RN Phone Number: 01/26/2022, 9:25 AM  Clinical Narrative:                  Per ID note patient will require 10 days IV antibiotics TOC to discuss with patient        Patient Goals and CMS Choice            Expected Discharge Plan and Services                                              Prior Living Arrangements/Services                       Activities of Daily Living Home Assistive Devices/Equipment: None ADL Screening (condition at time of admission) Patient's cognitive ability adequate to safely complete daily activities?: Yes Is the patient deaf or have difficulty hearing?: No Does the patient have difficulty seeing, even when wearing glasses/contacts?: No Does the patient have difficulty concentrating, remembering, or making decisions?: No Patient able to express need for assistance with ADLs?: Yes Does the patient have difficulty dressing or bathing?: No Independently performs ADLs?: Yes (appropriate for developmental age) Does the patient have difficulty walking or climbing stairs?: No Weakness of Legs: None Weakness of Arms/Hands: None  Permission Sought/Granted                  Emotional Assessment              Admission diagnosis:  Epigastric pain [R10.13] Pancreatitis due to biliary obstruction [K85.90, K83.1] Patient Active Problem List   Diagnosis Date Noted   Choledocholithiasis 01/25/2022   Pancreatitis due to biliary obstruction 01/24/2022   PCP:  Freddy Finner, NP Pharmacy:  No Pharmacies Listed    Social Determinants of Health (SDOH) Social History: SDOH Screenings   Food Insecurity: No Food Insecurity (01/25/2022)  Housing: Low Risk  (01/25/2022)  Transportation Needs: No Transportation Needs (01/25/2022)   Utilities: Not At Risk (01/25/2022)  Tobacco Use: Low Risk  (01/25/2022)   SDOH Interventions:     Readmission Risk Interventions     No data to display

## 2022-01-26 NOTE — Progress Notes (Signed)
Date of Admission:  01/24/2022      ID: Ana Peterson is a 77 y.o. female  Principal Problem:   Pancreatitis due to biliary obstruction Active Problems:   Choledocholithiasis  [employer] video interpreting service used for spanish language  Subjective: Pt is doing better No pain abdomen No fever   Medications:   enoxaparin (LOVENOX) injection  40 mg Subcutaneous Q24H   phosphorus  500 mg Oral TID   sodium bicarbonate  650 mg Oral TID   sodium chloride flush  3 mL Intravenous Q12H    Objective: Vital signs in last 24 hours: Temp:  [98.4 F (36.9 C)-98.8 F (37.1 C)] 98.7 F (37.1 C) (01/03 0748) Pulse Rate:  [70-89] 70 (01/03 0748) Resp:  [11-20] 17 (01/03 0748) BP: (102-130)/(52-78) 111/52 (01/03 0748) SpO2:  [93 %-97 %] 97 % (01/03 0748)   PHYSICAL EXAM:  General: Alert, cooperative, no distress, appears stated age.  Lungs: Clear to auscultation bilaterally. No Wheezing or Rhonchi. No rales. Heart: Regular rate and rhythm, no murmur, rub or gallop. Abdomen: Soft, non-tender,not distended. Bowel sounds normal. No masses Extremities: atraumatic, no cyanosis. No edema. No clubbing Skin: No rashes or lesions. Or bruising Lymph: Cervical, supraclavicular normal. Neurologic: Grossly non-focal  Lab Results Recent Labs    01/25/22 0515 01/26/22 0448  WBC 20.1* 9.2  HGB 9.0* 8.6*  HCT 27.3* 26.5*  NA 139 142  K 4.6 4.2  CL 113* 117*  CO2 21* 17*  BUN 25* 27*  CREATININE 1.17* 1.34*   Liver Panel Recent Labs    01/24/22 1645 01/24/22 1826 01/25/22 0515 01/26/22 0448  PROT  --    < > 6.1* 5.8*  ALBUMIN  --    < > 2.9* 2.8*  AST  --    < > 290* 108*  ALT  --    < > 207* 133*  ALKPHOS  --    < > 59 66  BILITOT 1.8*   < > 2.2* 1.5*  BILIDIR 1.1*  --   --  0.9*  IBILI 0.7  --   --  0.6   < > = values in this interval not displayed.   Microbiology: 01/24/22- BC ESBL ecoli 01/26/22 BC- Studies/Results: DG C-Arm 1-60 Min-No Report  Result Date:  01/25/2022 Fluoroscopy was utilized by the requesting physician.  No radiographic interpretation.   MR ABDOMEN MRCP WO CONTRAST  Result Date: 01/25/2022 CLINICAL DATA:  Abdominal pain with cholelithiasis. Biliary duct dilatation on recent ultrasound. EXAM: MRI ABDOMEN WITHOUT CONTRAST  (INCLUDING MRCP) TECHNIQUE: Multiplanar multisequence MR imaging of the abdomen was performed. Heavily T2-weighted images of the biliary and pancreatic ducts were obtained, and three-dimensional MRCP images were rendered by post processing. COMPARISON:  Abdomen/pelvis CT 01/24/2022. Abdominal ultrasound exam 01/24/2022. FINDINGS: Motion degraded study secondary to limitations in reproducible breath holding. Lower chest: No pleural effusion. Hepatobiliary: No suspicious focal abnormality in the liver on this study without intravenous contrast. Multiple gallstones are evident measuring in the 10-11 mm size range (see coronal haste image 14 of series 3). Common bile duct diameter measures up to 18 mm. There is a relatively large tubular shaped filling defect in the common duct/common bile duct measuring 1.1 x 0.9 x 5.1 cm (see coronal T2 image 17 of series 3 and 3D MRCP image 38 of series 17). Review of the previous CT shows increased attenuation in the common duct but no calcification despite calcification of the stones in the gallbladder lumen. Pre contrast T1 imaging of the  duct is nondiagnostic due to motion artifact. There is probably at 3-4 mm stone distal to the dominant filling defect near the ampulla. Pancreas: No focal mass lesion. No dilatation of the main duct. No intraparenchymal cyst. No peripancreatic edema. Spleen:  No splenomegaly. No focal mass lesion. Adrenals/Urinary Tract: No adrenal nodule or mass. Kidneys unremarkable. Stomach/Bowel: Stomach is unremarkable. No gastric wall thickening. No evidence of outlet obstruction. Duodenum is normally positioned as is the ligament of Treitz. No small bowel or colonic  dilatation within the visualized abdomen. Colonic diverticulosis evident. Vascular/Lymphatic: No abdominal aortic aneurysm. No abdominal lymphadenopathy. Other:  No intraperitoneal free fluid. Musculoskeletal: No suspicious marrow signal abnormality. IMPRESSION: 1. Motion degraded study secondary to limitations in reproducible breath holding. 2. 1.1 x 0.9 x 5.1 cm tubular shaped filling defect in the common duct/common bile duct. Review of the previous CT shows increased attenuation in the common duct lumen but no calcification despite calcification of the stones in the gallbladder lumen. This tubular structure has a cast-like appearance and may be an unusual noncalcified ductal stone. Intraluminal blood clot would be a consideration although motion degradation on T1 imaging hinders assessment. Given the well-defined margins and low signal intensity on T2 imaging, tumefactive sludge is not considered likely. There may be a 3-4 mm common bile duct stone distal to the dominant filling defect near the ampulla. Consider ERCP to further evaluate. 3. Cholelithiasis. 4. Colonic diverticulosis. Electronically Signed   By: Misty Stanley M.D.   On: 01/25/2022 06:21   MR 3D Recon At Scanner  Result Date: 01/25/2022 CLINICAL DATA:  Abdominal pain with cholelithiasis. Biliary duct dilatation on recent ultrasound. EXAM: MRI ABDOMEN WITHOUT CONTRAST  (INCLUDING MRCP) TECHNIQUE: Multiplanar multisequence MR imaging of the abdomen was performed. Heavily T2-weighted images of the biliary and pancreatic ducts were obtained, and three-dimensional MRCP images were rendered by post processing. COMPARISON:  Abdomen/pelvis CT 01/24/2022. Abdominal ultrasound exam 01/24/2022. FINDINGS: Motion degraded study secondary to limitations in reproducible breath holding. Lower chest: No pleural effusion. Hepatobiliary: No suspicious focal abnormality in the liver on this study without intravenous contrast. Multiple gallstones are evident  measuring in the 10-11 mm size range (see coronal haste image 14 of series 3). Common bile duct diameter measures up to 18 mm. There is a relatively large tubular shaped filling defect in the common duct/common bile duct measuring 1.1 x 0.9 x 5.1 cm (see coronal T2 image 17 of series 3 and 3D MRCP image 38 of series 17). Review of the previous CT shows increased attenuation in the common duct but no calcification despite calcification of the stones in the gallbladder lumen. Pre contrast T1 imaging of the duct is nondiagnostic due to motion artifact. There is probably at 3-4 mm stone distal to the dominant filling defect near the ampulla. Pancreas: No focal mass lesion. No dilatation of the main duct. No intraparenchymal cyst. No peripancreatic edema. Spleen:  No splenomegaly. No focal mass lesion. Adrenals/Urinary Tract: No adrenal nodule or mass. Kidneys unremarkable. Stomach/Bowel: Stomach is unremarkable. No gastric wall thickening. No evidence of outlet obstruction. Duodenum is normally positioned as is the ligament of Treitz. No small bowel or colonic dilatation within the visualized abdomen. Colonic diverticulosis evident. Vascular/Lymphatic: No abdominal aortic aneurysm. No abdominal lymphadenopathy. Other:  No intraperitoneal free fluid. Musculoskeletal: No suspicious marrow signal abnormality. IMPRESSION: 1. Motion degraded study secondary to limitations in reproducible breath holding. 2. 1.1 x 0.9 x 5.1 cm tubular shaped filling defect in the common duct/common bile duct. Review of  the previous CT shows increased attenuation in the common duct lumen but no calcification despite calcification of the stones in the gallbladder lumen. This tubular structure has a cast-like appearance and may be an unusual noncalcified ductal stone. Intraluminal blood clot would be a consideration although motion degradation on T1 imaging hinders assessment. Given the well-defined margins and low signal intensity on T2 imaging,  tumefactive sludge is not considered likely. There may be a 3-4 mm common bile duct stone distal to the dominant filling defect near the ampulla. Consider ERCP to further evaluate. 3. Cholelithiasis. 4. Colonic diverticulosis. Electronically Signed   By: Kennith Center M.D.   On: 01/25/2022 06:21   US Abdomen Limited RUQ (LIVER/GB)  Result Date: 01/24/2022 CLINICAL DATA:  Right upper quadrant pain EXAM: ULTRASOUND ABDOMEN LIMITED RIGHT UPPER QUADRANT COMPARISON:  CT 01/24/2022 FINDINGS: Gallbladder: Multiple gallstones. Normal wall thickness. Negative sonographic Murphy. Common bile duct: Diameter: 16 mm Liver: No focal lesion identified. Within normal limits in parenchymal echogenicity. Portal vein is patent on color Doppler imaging with normal direction of blood flow towards the liver. Other: None. IMPRESSION: Cholelithiasis without sonographic evidence for acute cholecystitis. Dilated common bile duct measuring 16 mm, correlate with LFTs and consider correlation with MRCP. Electronically Signed   By: Jasmine Pang M.D.   On: 01/24/2022 20:28   CT ABDOMEN PELVIS W CONTRAST  Result Date: 01/24/2022 CLINICAL DATA:  Mid epigastric pain and vomiting. EXAM: CT ABDOMEN AND PELVIS WITH CONTRAST TECHNIQUE: Multidetector CT imaging of the abdomen and pelvis was performed using the standard protocol following bolus administration of intravenous contrast. RADIATION DOSE REDUCTION: This exam was performed according to the departmental dose-optimization program which includes automated exposure control, adjustment of the mA and/or kV according to patient size and/or use of iterative reconstruction technique. CONTRAST:  40mL OMNIPAQUE IOHEXOL 300 MG/ML  SOLN COMPARISON:  None Available. FINDINGS: Lower chest: Mild atelectasis is seen within the bilateral lung bases. Hepatobiliary: No focal liver abnormality is seen. Multiple 9 mm and 10 mm gallstones are seen within the dependent portion of a moderately distended  gallbladder. There is no evidence of gallbladder wall thickening or pericholecystic inflammation. The common bile duct is dilated and measures 14 mm in diameter. Pancreas: Unremarkable. No pancreatic ductal dilatation or surrounding inflammatory changes. Spleen: Normal in size without focal abnormality. Adrenals/Urinary Tract: Adrenal glands are unremarkable. Kidneys are normal, without renal calculi, focal lesion, or hydronephrosis. A small amount of air is seen within the lumen of a poorly distended urinary bladder. Stomach/Bowel: Stomach is within normal limits. Appendix appears normal. No evidence of bowel wall thickening, distention, or inflammatory changes. Noninflamed diverticula are seen throughout the large bowel. Vascular/Lymphatic: Aortic atherosclerosis. No enlarged abdominal or pelvic lymph nodes. Reproductive: A 2.2 cm x 2.0 cm x 2.2 cm heterogeneous uterine fibroid is seen along the anterolateral aspect of the uterine fundus on the right. A 0.9 cm x 0.7 cm x 0.8 cm hyperdense focus is seen within the endometrium. The bilateral adnexa are unremarkable. Other: No abdominal wall hernia or abnormality. No abdominopelvic ascites. Musculoskeletal: No acute or significant osseous findings. IMPRESSION: 1. Cholelithiasis. 2. Colonic diverticulosis. 3. Small amount of air within the lumen of a poorly distended urinary bladder. This may be secondary to recent catheterization. Correlation with urinalysis is recommended to exclude the presence of an acute cystitis. 4. Uterine fibroid. 5. Hyperdense focus within the endometrium which may represent a small amount of blood products. Correlation with nonemergent pelvic ultrasound is recommended, as a small mass  cannot be excluded. 6. Aortic atherosclerosis. Aortic Atherosclerosis (ICD10-I70.0). Electronically Signed   By: Aram Candela M.D.   On: 01/24/2022 19:34     Assessment/Plan: Cholelithiasis with CBD obstruction with ascending cholangitis resolved after  ERCP and clearing of blocked stone She is going for lap cholecystectomy tomorrow  ESBL ecoli bacteremia due to the above On meropenem- day 2- plan for 7 more days LFTS normalizing  Discussed the management with patient, her husband and care team

## 2022-01-26 NOTE — H&P (View-Only) (Signed)
[facility] SURGICAL CONSULTATION NOTE (initial) - cpt: 99254   HISTORY OF PRESENT ILLNESS (HPI):  77 y.o. female presented to [facility] ED January 1 for evaluation of abdominal pain. Patient reports complete resolution of back and abdominal pain following ERCP from yesterday.  No prior history of abdominal surgeries.  Surgery is consulted by hospitalist physician Dr. Dwyane Dee in this context for evaluation and management calculus cholecystitis status post ERCP for choledocholithiasis with cholangitis. Choledocholithiasis was found. Complete removal was accomplished by biliary sphincterotomy and balloon extraction. - A biliary sphincterotomy was performed. - The biliary tree was swept.    PAST MEDICAL HISTORY (PMH):  Past Medical History:  Diagnosis Date   Edema    legs/feet   Hypertension    Syncope    Vertigo      PAST SURGICAL HISTORY ([place]):  Past Surgical History:  Procedure Laterality Date   CATARACT EXTRACTION W/PHACO Right 07/22/2014   Procedure: CATARACT EXTRACTION PHACO AND INTRAOCULAR LENS PLACEMENT ([place]);  Surgeon: Birder Robson, MD;  Location: [facility] ORS;  Service: Ophthalmology;  Laterality: Right;  [place]   1:02.7 AP%  25.3 CDE  15.88 fluid pack lot # [id number]   ERCP N/A 01/25/2022   Procedure: ENDOSCOPIC RETROGRADE CHOLANGIOPANCREATOGRAPHY (ERCP);  Surgeon: Lucilla Lame, MD;  Location: [facility] ENDOSCOPY;  Service: Endoscopy;  Laterality: N/A;   EYE SURGERY       MEDICATIONS:  Prior to Admission medications   Medication Sig Start Date End Date Taking? Authorizing Provider  Cholecalciferol (VITAMIN D3) 50 MCG (2000 UT) capsule Take 2,000 Units by mouth daily. 11/11/21  Yes [provider]  cyanocobalamin (VITAMIN B12) 1000 MCG tablet Take 1,000 mcg by mouth daily. 11/11/21  Yes [provider]  fluticasone (FLONASE) 50 MCG/ACT nasal spray Place 1 spray into both nostrils daily. 10/08/21  Yes [provider]  lisinopril  (PRINIVIL,ZESTRIL) 40 MG tablet Take 40 mg by mouth daily.   Yes [provider]  meclizine (ANTIVERT) 25 MG tablet Take 25 mg by mouth 3 (three) times daily as needed. 01/11/22  Yes [provider]  ALLEGRA ALLERGY 180 MG tablet Take 180 mg by mouth daily. Patient not taking: Reported on 01/24/2022 08/17/21   [provider]     ALLERGIES:  No Known Allergies   SOCIAL HISTORY:  Social History   Socioeconomic History   Marital status: Married    Spouse name: Not on file   Number of children: Not on file   Years of education: Not on file   Highest education level: Not on file  Occupational History   Not on file  Tobacco Use   Smoking status: Never    Passive exposure: Never   Smokeless tobacco: Never  Vaping Use   Vaping Use: Never used  Substance and Sexual Activity   Alcohol use: Not on file   Drug use: Not on file   Sexual activity: Not on file  Other Topics Concern   Not on file  Social History Narrative   Not on file   Social Determinants of Health   Financial Resource Strain: Not on file  Food Insecurity: No Food Insecurity (01/25/2022)   Hunger Vital Sign    Worried About Running Out of Food in the Last Year: Never true    Ran Out of Food in the Last Year: Never true  Transportation Needs: No Transportation Needs (01/25/2022)   PRAPARE - Hydrologist (Medical): No    Lack of Transportation (Non-Medical): No  Physical Activity: Not on file  Stress: Not on file  Social Connections: Not on file  Intimate Partner Violence: Not At Risk (01/25/2022)   Humiliation, Afraid, Rape, and Kick questionnaire    Fear of Current or Ex-Partner: No    Emotionally Abused: No    Physically Abused: No    Sexually Abused: No     FAMILY HISTORY:  Family History  Problem Relation Age of Onset   Breast cancer Neg Hx      VITAL SIGNS:  Temp:  [98.4 F (36.9 C)-98.8 F (37.1 C)] 98.8 F (37.1 C) (01/03 1513) Pulse Rate:   [70-89] 87 (01/03 1513) Resp:  [16-20] 18 (01/03 1513) BP: (105-144)/(52-76) 144/76 (01/03 1513) SpO2:  [93 %-97 %] 97 % (01/03 1513)     Height: 5\' 5"  (165.1 cm) Weight: 74.8 kg BMI (Calculated): 27.46   INTAKE/OUTPUT:  No intake/output data recorded.  PHYSICAL EXAM:  Physical Exam Blood pressure (!) 144/76, pulse 87, temperature 98.8 F (37.1 C), temperature source Oral, resp. rate 18, height 5\' 5"  (1.651 m), weight 74.8 kg, SpO2 97 %. Last Weight  Most recent update: 01/25/2022  3:41 AM    Weight  74.8 kg (165 lb)             CONSTITUTIONAL: Well developed, and nourished, appropriately responsive and aware without distress.   EYES: Sclera non-icteric.   EARS, NOSE, MOUTH AND THROAT:  The oropharynx is clear. Oral mucosa is pink and moist.   Hearing is intact to voice.  NECK: Trachea is midline, and there is no jugular venous distension.  LYMPH NODES:  Lymph nodes in the neck are not enlarged. RESPIRATORY:  Lungs are clear, and breath sounds are equal bilaterally. Normal respiratory effort without pathologic use of accessory muscles. CARDIOVASCULAR: Heart is regular in rate and rhythm. GI: The abdomen is soft, nontender, and nondistended. There were no palpable masses. I did not appreciate hepatosplenomegaly. There were normal bowel sounds. MUSCULOSKELETAL:  Symmetrical muscle tone appreciated in all four extremities.    SKIN: Skin turgor is normal. No pathologic skin lesions appreciated.  NEUROLOGIC:  Motor and sensation appear grossly normal.  Cranial nerves are grossly without defect. PSYCH:  Alert and oriented to person, place and time. Affect is appropriate for situation.  Data Reviewed I have personally reviewed what is currently available of the patient's imaging, recent labs and medical records.    Labs:     Latest Ref Rng & Units 01/26/2022    4:48 AM 01/25/2022    5:15 AM 01/24/2022    6:26 PM  CBC  WBC 4.0 - 10.5 K/uL 9.2  20.1  11.0   Hemoglobin 12.0 - 15.0 g/dL 8.6   9.0  11.5   Hematocrit 36.0 - 46.0 % 26.5  27.3  35.0   Platelets 150 - 400 K/uL 127  152  173       Latest Ref Rng & Units 01/26/2022    4:48 AM 01/25/2022    5:15 AM 01/24/2022    6:26 PM  CMP  Glucose 70 - 99 mg/dL 89  146  144   BUN 8 - 23 mg/dL 27  25  28    Creatinine 0.44 - 1.00 mg/dL 1.34  1.17  1.36   Sodium 135 - 145 mmol/L 142  139  137   Potassium 3.5 - 5.1 mmol/L 4.2  4.6  4.2   Chloride 98 - 111 mmol/L 117  113  105   CO2 22 - 32 mmol/L 17  21  19   Calcium 8.9 - 10.3 mg/dL 7.7  8.0  9.2   Total Protein 6.5 - 8.1 g/dL 5.8  6.1  7.5   Total Bilirubin 0.3 - 1.2 mg/dL 1.5  2.2  1.8   Alkaline Phos 38 - 126 U/L 66  59  80   AST 15 - 41 U/L 108  290  624   ALT 0 - 44 U/L 133  207  239      Imaging studies:   Last 24 hrs: No results found.   Assessment/Plan:  77 y.o. female with history of cholangitis with choledocholithiasis, status post ERCP with common bile duct clearance.  Desiring to proceed with definitive cholecystectomy during this hospitalization.  Patient Active Problem List   Diagnosis Date Noted   Choledocholithiasis 01/25/2022   Pancreatitis due to biliary obstruction 01/24/2022    -ICG prior to call to the OR, anticipate robotic cholecystectomy tomorrow.  -Continue IV antibiotics, may have clear liquids today with n.p.o. at midnight.   - DVT prophylaxis  This was discussed thoroughly, utilizing 2 video interpreters due to a loss of contact during the first interpreter.  Optimal plan is for robotic cholecystectomy utilizing ICG imaging. Risks and benefits have been discussed with the patient which include but are not limited to anesthesia, bleeding, infection, biliary ductal injury, resulting in leak or stenosis, other associated unanticipated injuries affiliated with laparoscopic surgery.   Reviewed that removing the gallbladder will only address the symptoms related to the gallbladder itself.  I believe there is the desire to proceed, accepting the risks  with understanding.  Questions elicited and answered to satisfaction.   No guarantees ever expressed or implied.  All of the above findings and recommendations were discussed with the patient and  family(if present), and all of patient's and present family's questions were answered to their expressed satisfaction.  Thank you for the opportunity to participate in this patient's care.   -- Ronny Bacon, M.D., FACS 01/26/2022, 4:44 PM

## 2022-01-26 NOTE — Consult Note (Signed)
[facility] SURGICAL CONSULTATION NOTE (initial) - cpt: 99254   HISTORY OF PRESENT ILLNESS (HPI):  77 y.o. female presented to [facility] ED January 1 for evaluation of abdominal pain. Patient reports complete resolution of back and abdominal pain following ERCP from yesterday.  No prior history of abdominal surgeries.  Surgery is consulted by hospitalist physician Dr. Dwyane Dee in this context for evaluation and management calculus cholecystitis status post ERCP for choledocholithiasis with cholangitis. Choledocholithiasis was found. Complete removal was accomplished by biliary sphincterotomy and balloon extraction. - A biliary sphincterotomy was performed. - The biliary tree was swept.    PAST MEDICAL HISTORY (PMH):  Past Medical History:  Diagnosis Date   Edema    legs/feet   Hypertension    Syncope    Vertigo      PAST SURGICAL HISTORY ([place]):  Past Surgical History:  Procedure Laterality Date   CATARACT EXTRACTION W/PHACO Right 07/22/2014   Procedure: CATARACT EXTRACTION PHACO AND INTRAOCULAR LENS PLACEMENT ([place]);  Surgeon: Birder Robson, MD;  Location: [facility] ORS;  Service: Ophthalmology;  Laterality: Right;  [place]   1:02.7 AP%  25.3 CDE  15.88 fluid pack lot # [id number]   ERCP N/A 01/25/2022   Procedure: ENDOSCOPIC RETROGRADE CHOLANGIOPANCREATOGRAPHY (ERCP);  Surgeon: Lucilla Lame, MD;  Location: [facility] ENDOSCOPY;  Service: Endoscopy;  Laterality: N/A;   EYE SURGERY       MEDICATIONS:  Prior to Admission medications   Medication Sig Start Date End Date Taking? Authorizing Provider  Cholecalciferol (VITAMIN D3) 50 MCG (2000 UT) capsule Take 2,000 Units by mouth daily. 11/11/21  Yes [provider]  cyanocobalamin (VITAMIN B12) 1000 MCG tablet Take 1,000 mcg by mouth daily. 11/11/21  Yes [provider]  fluticasone (FLONASE) 50 MCG/ACT nasal spray Place 1 spray into both nostrils daily. 10/08/21  Yes [provider]  lisinopril  (PRINIVIL,ZESTRIL) 40 MG tablet Take 40 mg by mouth daily.   Yes [provider]  meclizine (ANTIVERT) 25 MG tablet Take 25 mg by mouth 3 (three) times daily as needed. 01/11/22  Yes [provider]  ALLEGRA ALLERGY 180 MG tablet Take 180 mg by mouth daily. Patient not taking: Reported on 01/24/2022 08/17/21   [provider]     ALLERGIES:  No Known Allergies   SOCIAL HISTORY:  Social History   Socioeconomic History   Marital status: Married    Spouse name: Not on file   Number of children: Not on file   Years of education: Not on file   Highest education level: Not on file  Occupational History   Not on file  Tobacco Use   Smoking status: Never    Passive exposure: Never   Smokeless tobacco: Never  Vaping Use   Vaping Use: Never used  Substance and Sexual Activity   Alcohol use: Not on file   Drug use: Not on file   Sexual activity: Not on file  Other Topics Concern   Not on file  Social History Narrative   Not on file   Social Determinants of Health   Financial Resource Strain: Not on file  Food Insecurity: No Food Insecurity (01/25/2022)   Hunger Vital Sign    Worried About Running Out of Food in the Last Year: Never true    Ran Out of Food in the Last Year: Never true  Transportation Needs: No Transportation Needs (01/25/2022)   PRAPARE - Hydrologist (Medical): No    Lack of Transportation (Non-Medical): No  Physical Activity: Not on file  Stress: Not on file  Social Connections: Not on file  Intimate Partner Violence: Not At Risk (01/25/2022)   Humiliation, Afraid, Rape, and Kick questionnaire    Fear of Current or Ex-Partner: No    Emotionally Abused: No    Physically Abused: No    Sexually Abused: No     FAMILY HISTORY:  Family History  Problem Relation Age of Onset   Breast cancer Neg Hx      VITAL SIGNS:  Temp:  [98.4 F (36.9 C)-98.8 F (37.1 C)] 98.8 F (37.1 C) (01/03 1513) Pulse Rate:   [70-89] 87 (01/03 1513) Resp:  [16-20] 18 (01/03 1513) BP: (105-144)/(52-76) 144/76 (01/03 1513) SpO2:  [93 %-97 %] 97 % (01/03 1513)     Height: 5\' 5"  (165.1 cm) Weight: 74.8 kg BMI (Calculated): 27.46   INTAKE/OUTPUT:  No intake/output data recorded.  PHYSICAL EXAM:  Physical Exam Blood pressure (!) 144/76, pulse 87, temperature 98.8 F (37.1 C), temperature source Oral, resp. rate 18, height 5\' 5"  (1.651 m), weight 74.8 kg, SpO2 97 %. Last Weight  Most recent update: 01/25/2022  3:41 AM    Weight  74.8 kg (165 lb)             CONSTITUTIONAL: Well developed, and nourished, appropriately responsive and aware without distress.   EYES: Sclera non-icteric.   EARS, NOSE, MOUTH AND THROAT:  The oropharynx is clear. Oral mucosa is pink and moist.   Hearing is intact to voice.  NECK: Trachea is midline, and there is no jugular venous distension.  LYMPH NODES:  Lymph nodes in the neck are not enlarged. RESPIRATORY:  Lungs are clear, and breath sounds are equal bilaterally. Normal respiratory effort without pathologic use of accessory muscles. CARDIOVASCULAR: Heart is regular in rate and rhythm. GI: The abdomen is soft, nontender, and nondistended. There were no palpable masses. I did not appreciate hepatosplenomegaly. There were normal bowel sounds. MUSCULOSKELETAL:  Symmetrical muscle tone appreciated in all four extremities.    SKIN: Skin turgor is normal. No pathologic skin lesions appreciated.  NEUROLOGIC:  Motor and sensation appear grossly normal.  Cranial nerves are grossly without defect. PSYCH:  Alert and oriented to person, place and time. Affect is appropriate for situation.  Data Reviewed I have personally reviewed what is currently available of the patient's imaging, recent labs and medical records.    Labs:     Latest Ref Rng & Units 01/26/2022    4:48 AM 01/25/2022    5:15 AM 01/24/2022    6:26 PM  CBC  WBC 4.0 - 10.5 K/uL 9.2  20.1  11.0   Hemoglobin 12.0 - 15.0 g/dL 8.6   9.0  11.5   Hematocrit 36.0 - 46.0 % 26.5  27.3  35.0   Platelets 150 - 400 K/uL 127  152  173       Latest Ref Rng & Units 01/26/2022    4:48 AM 01/25/2022    5:15 AM 01/24/2022    6:26 PM  CMP  Glucose 70 - 99 mg/dL 89  146  144   BUN 8 - 23 mg/dL 27  25  28    Creatinine 0.44 - 1.00 mg/dL 1.34  1.17  1.36   Sodium 135 - 145 mmol/L 142  139  137   Potassium 3.5 - 5.1 mmol/L 4.2  4.6  4.2   Chloride 98 - 111 mmol/L 117  113  105   CO2 22 - 32 mmol/L 17  21  19   Calcium 8.9 - 10.3 mg/dL 7.7  8.0  9.2   Total Protein 6.5 - 8.1 g/dL 5.8  6.1  7.5   Total Bilirubin 0.3 - 1.2 mg/dL 1.5  2.2  1.8   Alkaline Phos 38 - 126 U/L 66  59  80   AST 15 - 41 U/L 108  290  624   ALT 0 - 44 U/L 133  207  239      Imaging studies:   Last 24 hrs: No results found.   Assessment/Plan:  77 y.o. female with history of cholangitis with choledocholithiasis, status post ERCP with common bile duct clearance.  Desiring to proceed with definitive cholecystectomy during this hospitalization.  Patient Active Problem List   Diagnosis Date Noted   Choledocholithiasis 01/25/2022   Pancreatitis due to biliary obstruction 01/24/2022    -ICG prior to call to the OR, anticipate robotic cholecystectomy tomorrow.  -Continue IV antibiotics, may have clear liquids today with n.p.o. at midnight.   - DVT prophylaxis  This was discussed thoroughly, utilizing 2 video interpreters due to a loss of contact during the first interpreter.  Optimal plan is for robotic cholecystectomy utilizing ICG imaging. Risks and benefits have been discussed with the patient which include but are not limited to anesthesia, bleeding, infection, biliary ductal injury, resulting in leak or stenosis, other associated unanticipated injuries affiliated with laparoscopic surgery.   Reviewed that removing the gallbladder will only address the symptoms related to the gallbladder itself.  I believe there is the desire to proceed, accepting the risks  with understanding.  Questions elicited and answered to satisfaction.   No guarantees ever expressed or implied.  All of the above findings and recommendations were discussed with the patient and  family(if present), and all of patient's and present family's questions were answered to their expressed satisfaction.  Thank you for the opportunity to participate in this patient's care.   -- Ronny Bacon, M.D., FACS 01/26/2022, 4:44 PM

## 2022-01-26 NOTE — TOC Initial Note (Signed)
Transition of Care [facility]) - Initial/Assessment Note    Patient Details  Name: Ana Peterson MRN: [id number] Date of Birth: [birth year]  Transition of Care [facility]) CM/SW Contact:    Beverly Sessions, RN Phone Number: 01/26/2022, 2:03 PM  Clinical Narrative:                  Met with patient and husband at bedside using ipad interpreter    Admitted VQX:[id number] and cholelithiasis , E. coli ESBL bacteremia due to cholangitis Sepsis due to E. coli ESBL bacteremia Admitted from: home with husband PCP: Elsworth Soho, husband or daughter transports to appointments  Current home health/prior home health/DME: independent at baseline  Per ID note patient will require 10 days of IV antibiotics Discussed with  patient and husband what the process would look like if she requires IV antibiotics at discharge.  Patient state that her daughter would likely be the teachable care giver.  Patient states that she does not have a preference of home health agency.  Referral made to [facility] with [facility] and Pam with [facility] for infusion       Patient Goals and CMS Choice            Expected Discharge Plan and Services                                              Prior Living Arrangements/Services                       Activities of Daily Living Home Assistive Devices/Equipment: None ADL Screening (condition at time of admission) Patient's cognitive ability adequate to safely complete daily activities?: Yes Is the patient deaf or have difficulty hearing?: No Does the patient have difficulty seeing, even when wearing glasses/contacts?: No Does the patient have difficulty concentrating, remembering, or making decisions?: No Patient able to express need for assistance with ADLs?: Yes Does the patient have difficulty dressing or bathing?: No Independently performs ADLs?: Yes (appropriate for developmental age) Does the patient have difficulty walking or  climbing stairs?: No Weakness of Legs: None Weakness of Arms/Hands: None  Permission Sought/Granted                  Emotional Assessment              Admission diagnosis:  Epigastric pain [R10.13] Pancreatitis due to biliary obstruction [K85.90, K83.1] Patient Active Problem List   Diagnosis Date Noted   Choledocholithiasis 01/25/2022   Pancreatitis due to biliary obstruction 01/24/2022   PCP:  Freddy Finner, NP Pharmacy:  No Pharmacies Listed    Social Determinants of Health (SDOH) Social History: SDOH Screenings   Food Insecurity: No Food Insecurity (01/25/2022)  Housing: Low Risk  (01/25/2022)  Transportation Needs: No Transportation Needs (01/25/2022)  Utilities: Not At Risk (01/25/2022)  Tobacco Use: Low Risk  (01/26/2022)   SDOH Interventions:     Readmission Risk Interventions     No data to display

## 2022-01-27 ENCOUNTER — Inpatient Hospital Stay: Payer: Medicare Other | Admitting: Certified Registered"

## 2022-01-27 ENCOUNTER — Other Ambulatory Visit: Payer: Self-pay

## 2022-01-27 ENCOUNTER — Encounter: Payer: Self-pay | Admitting: Internal Medicine

## 2022-01-27 ENCOUNTER — Encounter
Admission: EM | Disposition: A | Discharge status: Home Health | Payer: Self-pay | Source: Home / Self Care | Attending: Student

## 2022-01-27 DIAGNOSIS — K8064 Calculus of gallbladder and bile duct with chronic cholecystitis without obstruction: Secondary | ICD-10-CM | POA: Diagnosis not present

## 2022-01-27 DIAGNOSIS — K851 Biliary acute pancreatitis without necrosis or infection: Secondary | ICD-10-CM | POA: Diagnosis not present

## 2022-01-27 LAB — HEPATIC FUNCTION PANEL
ALT: 91 U/L — ABNORMAL HIGH (ref 0–44)
AST: 58 U/L — ABNORMAL HIGH (ref 15–41)
Albumin: 2.7 g/dL — ABNORMAL LOW (ref 3.5–5.0)
Alkaline Phosphatase: 73 U/L (ref 38–126)
Bilirubin, Direct: 0.3 mg/dL — ABNORMAL HIGH (ref 0.0–0.2)
Indirect Bilirubin: 0.6 mg/dL (ref 0.3–0.9)
Total Bilirubin: 0.9 mg/dL (ref 0.3–1.2)
Total Protein: 5.6 g/dL — ABNORMAL LOW (ref 6.5–8.1)

## 2022-01-27 LAB — GLUCOSE, CAPILLARY
Glucose-Capillary: 89 mg/dL (ref 70–99)
Glucose-Capillary: 121 mg/dL — ABNORMAL HIGH (ref 70–99)

## 2022-01-27 LAB — CBC
HCT: 25.7 % — ABNORMAL LOW (ref 36.0–46.0)
Hemoglobin: 8.3 g/dL — ABNORMAL LOW (ref 12.0–15.0)
MCH: 30.9 pg (ref 26.0–34.0)
MCHC: 32.3 g/dL (ref 30.0–36.0)
MCV: 95.5 fL (ref 80.0–100.0)
Platelets: 130 10*3/uL — ABNORMAL LOW (ref 150–400)
RBC: 2.69 MIL/uL — ABNORMAL LOW (ref 3.87–5.11)
RDW: 12.3 % (ref 11.5–15.5)
WBC: 5.9 10*3/uL (ref 4.0–10.5)
nRBC: 0 % (ref 0.0–0.2)

## 2022-01-27 LAB — LIPASE, BLOOD: Lipase: 39 U/L (ref 11–51)

## 2022-01-27 LAB — BASIC METABOLIC PANEL
Anion gap: 8 (ref 5–15)
BUN: 18 mg/dL (ref 8–23)
CO2: 22 mmol/L (ref 22–32)
Calcium: 7.9 mg/dL — ABNORMAL LOW (ref 8.9–10.3)
Chloride: 113 mmol/L — ABNORMAL HIGH (ref 98–111)
Creatinine, Ser: 1.1 mg/dL — ABNORMAL HIGH (ref 0.44–1.00)
GFR, Estimated: 52 mL/min — ABNORMAL LOW (ref >60)
Glucose, Bld: 90 mg/dL (ref 70–99)
Potassium: 3.8 mmol/L (ref 3.5–5.1)
Sodium: 143 mmol/L (ref 135–145)

## 2022-01-27 LAB — CULTURE, BLOOD (ROUTINE X 2): Special Requests: ADEQUATE

## 2022-01-27 LAB — VITAMIN B12: Vitamin B-12: 302 pg/mL (ref 180–914)

## 2022-01-27 LAB — PHOSPHORUS: Phosphorus: 3.6 mg/dL (ref 2.5–4.6)

## 2022-01-27 LAB — MAGNESIUM: Magnesium: 1.6 mg/dL — ABNORMAL LOW (ref 1.7–2.4)

## 2022-01-27 SURGERY — CHOLECYSTECTOMY, ROBOT-ASSISTED, LAPAROSCOPIC
Anesthesia: General

## 2022-01-27 MED ORDER — BUPIVACAINE-EPINEPHRINE (PF) 0.5% -1:200000 IJ SOLN
INTRAMUSCULAR | Status: AC
  Filled 2022-01-27: qty 30

## 2022-01-27 MED ORDER — FENTANYL CITRATE (PF) 100 MCG/2ML IJ SOLN
INTRAMUSCULAR | Status: DC | PRN
  Administered 2022-01-27: 50 ug via INTRAVENOUS

## 2022-01-27 MED ORDER — ROCURONIUM BROMIDE 100 MG/10ML IV SOLN
INTRAVENOUS | Status: DC | PRN
  Administered 2022-01-27: 50 mg via INTRAVENOUS

## 2022-01-27 MED ORDER — PROPOFOL 10 MG/ML IV BOLUS
INTRAVENOUS | Status: DC | PRN
  Administered 2022-01-27: 150 mg via INTRAVENOUS

## 2022-01-27 MED ORDER — MIDAZOLAM HCL 2 MG/2ML IJ SOLN
INTRAMUSCULAR | Status: AC
  Filled 2022-01-27: qty 2

## 2022-01-27 MED ORDER — ACETAMINOPHEN 10 MG/ML IV SOLN
INTRAVENOUS | Status: DC | PRN
  Administered 2022-01-27: 1000 mg via INTRAVENOUS

## 2022-01-27 MED ORDER — FENTANYL CITRATE (PF) 100 MCG/2ML IJ SOLN: 25.0000 ug | INTRAMUSCULAR | Status: DC | PRN

## 2022-01-27 MED ORDER — FENTANYL CITRATE (PF) 100 MCG/2ML IJ SOLN
INTRAMUSCULAR | Status: AC
  Filled 2022-01-27: qty 2

## 2022-01-27 MED ORDER — ONDANSETRON HCL 4 MG/2ML IJ SOLN
INTRAMUSCULAR | Status: DC | PRN
  Administered 2022-01-27 (×2): 4 mg via INTRAVENOUS

## 2022-01-27 MED ORDER — ONDANSETRON HCL 4 MG/2ML IJ SOLN: 4.0000 mg | Freq: Once | INTRAMUSCULAR | Status: DC | PRN

## 2022-01-27 MED ORDER — LIDOCAINE HCL (CARDIAC) PF 100 MG/5ML IV SOSY
PREFILLED_SYRINGE | INTRAVENOUS | Status: DC | PRN
  Administered 2022-01-27: 60 mg via INTRAVENOUS

## 2022-01-27 MED ORDER — HYDROCODONE-ACETAMINOPHEN 5-325 MG PO TABS
1.0000 | ORAL_TABLET | Freq: Four times a day (QID) | ORAL | Status: DC | PRN
  Administered 2022-01-27: 2 via ORAL
  Filled 2022-01-27: qty 2

## 2022-01-27 MED ORDER — ACETAMINOPHEN 10 MG/ML IV SOLN
INTRAVENOUS | Status: AC
  Filled 2022-01-27: qty 100

## 2022-01-27 MED ORDER — BUPIVACAINE-EPINEPHRINE (PF) 0.25% -1:200000 IJ SOLN
INTRAMUSCULAR | Status: DC | PRN
  Administered 2022-01-27: 30 mL

## 2022-01-27 MED ORDER — DEXAMETHASONE SODIUM PHOSPHATE 10 MG/ML IJ SOLN
INTRAMUSCULAR | Status: DC | PRN
  Administered 2022-01-27: 5 mg via INTRAVENOUS

## 2022-01-27 MED ORDER — GLYCOPYRROLATE 0.2 MG/ML IJ SOLN
INTRAMUSCULAR | Status: DC | PRN
  Administered 2022-01-27: .2 mg via INTRAVENOUS

## 2022-01-27 MED ORDER — BUPIVACAINE LIPOSOME 1.3 % IJ SUSP
INTRAMUSCULAR | Status: AC
  Filled 2022-01-27: qty 20

## 2022-01-27 MED ORDER — SUGAMMADEX SODIUM 500 MG/5ML IV SOLN
INTRAVENOUS | Status: DC | PRN
  Administered 2022-01-27: 400 mg via INTRAVENOUS

## 2022-01-27 MED ORDER — FOLIC ACID 1 MG PO TABS
1.0000 mg | ORAL_TABLET | Freq: Every day | ORAL | Status: DC
  Administered 2022-01-27 – 2022-01-28 (×2): 1 mg via ORAL
  Filled 2022-01-27 (×2): qty 1

## 2022-01-27 MED ORDER — MAGNESIUM SULFATE 2 GM/50ML IV SOLN
2.0000 g | Freq: Once | INTRAVENOUS | Status: AC
  Administered 2022-01-27: 2 g via INTRAVENOUS
  Filled 2022-01-27: qty 50

## 2022-01-27 MED ORDER — SEVOFLURANE IN SOLN
RESPIRATORY_TRACT | Status: AC
  Filled 2022-01-27: qty 250

## 2022-01-27 SURGICAL SUPPLY — 45 items
BAG PRESSURE INF REUSE 3000 (BAG) IMPLANT
CLIP LIGATING HEM O LOK PURPLE (MISCELLANEOUS) ×1 IMPLANT
COVER TIP SHEARS 8 DVNC (MISCELLANEOUS) ×1 IMPLANT
COVER TIP SHEARS 8MM DA VINCI (MISCELLANEOUS) ×1
DERMABOND ADVANCED .7 DNX12 (GAUZE/BANDAGES/DRESSINGS) ×1 IMPLANT
DRAPE ARM DVNC X/XI (DISPOSABLE) ×4 IMPLANT
DRAPE COLUMN DVNC XI (DISPOSABLE) ×1 IMPLANT
DRAPE DA VINCI XI ARM (DISPOSABLE) ×4
DRAPE DA VINCI XI COLUMN (DISPOSABLE) ×1
ELECT CAUTERY BLADE 6.4 (BLADE) ×1 IMPLANT
GLOVE ORTHO TXT STRL SZ7.5 (GLOVE) ×2 IMPLANT
GOWN STRL REUS W/ TWL LRG LVL3 (GOWN DISPOSABLE) ×2 IMPLANT
GOWN STRL REUS W/ TWL XL LVL3 (GOWN DISPOSABLE) ×2 IMPLANT
GOWN STRL REUS W/TWL LRG LVL3 (GOWN DISPOSABLE) ×4
GOWN STRL REUS W/TWL XL LVL3 (GOWN DISPOSABLE) ×4
GRASPER SUT TROCAR 14GX15 (MISCELLANEOUS) IMPLANT
IRRIGATION STRYKERFLOW (MISCELLANEOUS) IMPLANT
IRRIGATOR STRYKERFLOW (MISCELLANEOUS)
IRRIGATOR SUCT 8 DISP DVNC XI (IRRIGATION / IRRIGATOR) IMPLANT
IRRIGATOR SUCTION 8MM XI DISP (IRRIGATION / IRRIGATOR) ×1
IV NS IRRIG 3000ML ARTHROMATIC (IV SOLUTION) IMPLANT
KIT PINK PAD W/HEAD ARE REST (MISCELLANEOUS) ×1 IMPLANT
KIT PINK PAD W/HEAD ARM REST (MISCELLANEOUS) ×1 IMPLANT
KIT TURNOVER KIT A (KITS) ×1 IMPLANT
LABEL OR SOLS (LABEL) ×1 IMPLANT
MANIFOLD NEPTUNE II (INSTRUMENTS) ×1 IMPLANT
NDL INSUFFLATION 14GA 120MM (NEEDLE) IMPLANT
NEEDLE HYPO 22GX1.5 SAFETY (NEEDLE) ×1 IMPLANT
NEEDLE INSUFFLATION 14GA 120MM (NEEDLE) IMPLANT
NS IRRIG 500ML POUR BTL (IV SOLUTION) ×1 IMPLANT
PACK LAP CHOLECYSTECTOMY (MISCELLANEOUS) ×1 IMPLANT
SEAL CANN UNIV 5-8 DVNC XI (MISCELLANEOUS) ×4 IMPLANT
SEAL XI 5MM-8MM UNIVERSAL (MISCELLANEOUS) ×4
SET TUBE SMOKE EVAC HIGH FLOW (TUBING) ×1 IMPLANT
SOLUTION ELECTROLUBE (MISCELLANEOUS) ×1 IMPLANT
SPIKE FLUID TRANSFER (MISCELLANEOUS) ×1 IMPLANT
SUT MNCRL 4-0 (SUTURE) ×1
SUT MNCRL 4-0 27XMFL (SUTURE) ×1
SUT VICRYL 0 UR6 27IN ABS (SUTURE) ×1 IMPLANT
SUTURE MNCRL 4-0 27XMF (SUTURE) ×1 IMPLANT
SYS BAG RETRIEVAL 10MM (BASKET) ×1
SYSTEM BAG RETRIEVAL 10MM (BASKET) ×1 IMPLANT
TRAP FLUID SMOKE EVACUATOR (MISCELLANEOUS) ×1 IMPLANT
TROCAR Z-THREAD FIOS 11X100 BL (TROCAR) IMPLANT
WATER STERILE IRR 500ML POUR (IV SOLUTION) ×1 IMPLANT

## 2022-01-27 NOTE — Op Note (Signed)
Robotic cholecystectomy with Indocyamine Green Ductal Imaging.   Pre-operative Diagnosis: Chronic calculus cholecystitis, ascending cholangitis, and choledocholithiasis s/p ERCP with ductal clearance.   Post-operative Diagnosis:  Same.  Procedure: Robotic assisted laparoscopic cholecystectomy with Indocyamine Green Ductal Imaging.   Surgeon: Ronny Bacon, M.D., FACS  Anesthesia: General. with endotracheal tube  Findings: as expected.   Estimated Blood Loss: 50 mL         Drains: None         Specimens: Gallbladder           Complications: none  Procedure Details  The patient was seen again in the Holding Room.  1.25 mg dose of ICG was again administered intravenously.   The benefits, complications, treatment options, risks and expected outcomes were again reviewed with the patient. The likelihood of improving the patient's symptoms with return to their baseline status is good.  The patient and/or family concurred with the proposed plan, giving informed consent, again alternatives reviewed.  The patient was taken to Operating Room, identified, and the procedure verified as robotic assisted laparoscopic cholecystectomy.  Prior to the induction of general anesthesia, antibiotic prophylaxis was administered. VTE prophylaxis was in place. General endotracheal anesthesia was then administered and tolerated well. The patient was positioned in the supine position.  After the induction, the abdomen was prepped with Chloraprep and draped in the sterile fashion.  A Time Out was held and the above information confirmed.  A stab incision was made left upper quadrant, just below the costal margin at Palmer's point, approximately midclavicular line the Veres needle is passed with sensation of the layers to penetrate the abdominal wall and into the peritoneum.  Saline drop test is confirmed peritoneal placement.  Insufflation is initiated with carbon dioxide to pressures of 15 mmHg.  Right  para-umbilical local infiltration with quarter percent Marcaine with epinephrine is utilized.  Made a 12 mm incision on the right periumbilical site, I advanced an optical 15mm port under direct visualization into the peritoneal cavity.  Once the peritoneum was penetrated, insufflation was initiated.  The trocar was then advanced into the abdominal cavity under direct visualization. Pneumoperitoneum was then continued utilizing CO2 at 15 mmHg or less and tolerated well without any adverse changes in the patient's vital signs.  Two 8.5-mm ports were placed in the left lower quadrant and laterally, and one to the right lower quadrant, all under direct vision. All skin incisions  were infiltrated with a local anesthetic agent before making the incision and placing the trocars.  The patient was positioned  in reverse Trendelenburg, tilted the patient's left side down.  Da Vinci XI robot was then positioned on to the patient's left side, and docked.  The gallbladder was identified, the fundus grasped via the arm 4 Prograsp and retracted cephalad. Adhesions were lysed with scissors and cautery. Due to the transition of the narrowed costal margin and the thin walled GB, we were able to mobilize the GB, after some lateral tear from it's bed.  The infundibulum was identified grasped and retracted laterally, exposing the peritoneum overlying the triangle of Calot. This was then opened and dissected using cautery & scissors. An extended critical view of the cystic plate, cystic duct and cystic artery was obtained, aided by the ICG via FireFly which improved localization of the ductal anatomy.    The cystic duct was clearly identified and dissected to isolation.   Artery well isolated and sealed with bipolar and divided, and the cystic duct was triple clipped and  divided with scissors, as close to the gallbladder neck as feasible, thus leaving two on the remaining stump.   The gallbladder was taken from the gallbladder  fossa in a retrograde fashion with the electrocautery. The gallbladder was removed and placed in an Endocatch bag.  The liver bed is inspected. Hemostasis was confirmed.  The robot was undocked and moved away from the operative field. Robotic suction and irrigation was utilized and was aspirated clear.  The gallbladder and Endocatch sac were then removed through the infraumbilical port site.   Inspection of the right upper quadrant was performed. No bleeding, bile duct injury or leak, or bowel injury was noted. The infra-umbilical port site fascia was closed with interrumpted 0 Vicryl sutures using PMI/cone under direct visualization. Pneumoperitoneum was released and ports removed.  4-0 subcuticular Monocryl was used to close the skin. Dermabond was  applied.  The patient was then extubated and brought to the recovery room in stable condition. Sponge, lap, and needle counts were correct at closure and at the conclusion of the case.               Ronny Bacon, M.D., [facility] 01/27/2022 2:24 PM

## 2022-01-27 NOTE — Anesthesia Preprocedure Evaluation (Addendum)
Anesthesia Evaluation  Patient identified by MRN, date of birth, ID band Patient awake    Reviewed: Allergy & Precautions, NPO status , Patient's Chart, lab work & pertinent test results  Airway Mallampati: II  TM Distance: >3 FB Neck ROM: Full    Dental  (+) Edentulous Upper, Edentulous Lower   Pulmonary neg pulmonary ROS   Pulmonary exam normal breath sounds clear to auscultation       Cardiovascular Exercise Tolerance: Good hypertension, Pt. on medications negative cardio ROS Normal cardiovascular exam Rhythm:Regular     Neuro/Psych negative neurological ROS  negative psych ROS   GI/Hepatic negative GI ROS, Neg liver ROS,,,  Endo/Other  negative endocrine ROS    Renal/GU negative Renal ROS     Musculoskeletal   Abdominal  (+) + obese  Peds negative pediatric ROS (+)  Hematology negative hematology ROS (+)   Anesthesia Other Findings Past Medical History: No date: Edema     Comment:  legs/feet No date: Hypertension No date: Syncope No date: Vertigo  Past Surgical History: 07/22/2014: CATARACT EXTRACTION W/PHACO; Right     Comment:  Procedure: CATARACT EXTRACTION PHACO AND INTRAOCULAR               LENS PLACEMENT (IOC);  Surgeon: Birder Robson, MD;                Location: [facility] ORS;  Service: Ophthalmology;  Laterality:              Right;  [place]   1:02.7 AP%  25.3 CDE  15.88 fluid pack               lot # [id number] H 01/25/2022: ERCP; N/A     Comment:  Procedure: ENDOSCOPIC RETROGRADE               CHOLANGIOPANCREATOGRAPHY (ERCP);  Surgeon: Lucilla Lame,               MD;  Location: [facility] ENDOSCOPY;  Service: Endoscopy;                Laterality: N/A; No date: EYE SURGERY  BMI    Body Mass Index: 27.44 kg/m      Reproductive/Obstetrics negative OB ROS                             Anesthesia Physical Anesthesia Plan  ASA: 3  Anesthesia Plan: General   Post-op Pain  Management:    Induction: Intravenous  PONV Risk Score and Plan: Ondansetron, Dexamethasone, Midazolam and Treatment may vary due to age or medical condition  Airway Management Planned: Oral ETT  Additional Equipment:   Intra-op Plan:   Post-operative Plan: Extubation in OR  Informed Consent: I have reviewed the patients History and Physical, chart, labs and discussed the procedure including the risks, benefits and alternatives for the proposed anesthesia with the patient or authorized representative who has indicated his/her understanding and acceptance.     Dental Advisory Given  Plan Discussed with: CRNA and Surgeon  Anesthesia Plan Comments:        Anesthesia Quick Evaluation

## 2022-01-27 NOTE — Interval H&P Note (Signed)
History and Physical Interval Note:  01/27/2022 8:22 AM  Ana Peterson  has presented today for surgery, with the diagnosis of choledocholithiasis.  The various methods of treatment have been discussed with the patient and family. After consideration of risks, benefits and other options for treatment, the patient has consented to  Procedure(s): XI ROBOTIC ASSISTED LAPAROSCOPIC CHOLECYSTECTOMY (N/A) Gu-Win (ICG) (N/A) as a surgical intervention.  The patient's history has been reviewed, patient examined, no change in status, stable for surgery.  I have reviewed the patient's chart and labs.  Questions were answered to the patient's satisfaction.     Ronny Bacon

## 2022-01-27 NOTE — Progress Notes (Signed)
[facility] Progress Note  Patient: Ana Peterson    DXI:[id number]  DOA: 01/24/2022     Date of Service: the patient was seen and examined on 01/27/2022  Chief Complaint  Patient presents with   Abdominal Pain   Brief hospital course: Ana Peterson is a 77 y.o. female with medical history significant of Hypertension and obesity.  She presented to the emergency room on account of severe abdominal pain, associated with nausea and vomiting. In the ED, workup including CT scan of the abdomen pelvis were done. Showed evidence of cholelithiasis. Common bile duct was noted to be dilated to 14 mm. She was also noted to have evidence of elevated transaminases. Based on findings, this was concerning for obstructive biliary disease. The ED did discuss case with Dr. Louretta Parma of gastroenterology who recommended MRCP and fractionated bilirubin test. Based upon findings of MRCP, GI team will reevaluate patient for ERCP. Patient was referred to hospitalist service for admission.    Assessment and Plan:  Choledocholithiasis and cholelithiasis Patient presented with abdominal pain, nausea and vomiting, resolved  MRCP positive for CBD filling defect, recommended ERCP GI was consulted, ERCP was done and CBD stone was removed. LFTs improving Started clear liquid diet, advance as per tolerance tomorrow a.m. Watch for any signs of pancreatitis Continue symptomatic treatment for pain control Continue IV fluid for hydration GI following.   E. coli ESBL bacteremia due to cholangitis Sepsis due to E. coli ESBL bacteremia Patient was hypotensive and elevated lactic acid level, leukocytosis S/p ceftriaxone which was discontinued and started on meropenem 1 g IV every 12 hours ID consulted, recommended meropenem for 7 days 1/3 repeat blood cultures NGTD  Cholangitis Continue IV antibiotics General surgery consulted, plan for lap chole today, patient is n.p.o. since midnight   Metabolic acidosis, CO2  17 Started oral bicarbonate supplement. Slightly elevated creatinine could be due to dehydration, continue IV fluid  Hypomagnesemia, Mag repleted.  Monitor and replete as needed. Hypophosphatemia, mild, phosphorus repleted orally.   Acute pancreatitis due to CBD stone Continue IV fluid for hydration Lipase trended down   Hypertension, blood pressure remained soft Patient was on lisinopril and HCTZ prn at home which has been held for now Continue to monitor BP Use IV hydralazine as needed if blood pressure remains high   Mild hyperglycemia Check hemoglobin A1c  Anemia of chronic disease, follow anemia workup Monitor H&H   Body mass index is 27.46 kg/m.  Interventions:    Diet: NPO since MN for Lapchole, advance diet to low-fat after surgery DVT Prophylaxis: Subcutaneous Lovenox   Advance goals of care discussion: Full code  Family Communication: family was present at bedside, at the time of interview.  The pt provided permission to discuss medical plan with the family. Opportunity was given to ask question and all questions were answered satisfactorily.   Disposition:  Pt is from Home, admitted with Abd pain due to CBD stone, s/p ERCP, E. coli ESBL bacteremia, still on IV antibiotics, ID consulted rec 7 days IV Abx, Gen Sx plan for lap chole today, which precludes a safe discharge. Discharge to home, when stable and cleared by GI, surgery and ID, needs IV antibiotics x 7 days   Subjective: No significant overnight events, patient denies any abdominal pain, no nausea vomiting.  Patient is n.p.o. since midnight for lap chole. Patient was resting comfortably, denied any active issues.   Physical Exam: General:  NAD, AAO x 3 Appear in no distress, affect appropriate Eyes: PERRLA ENT: Oral  Mucosa Clear, moist  Neck: no JVD,  Cardiovascular: S1 and S2 Present, no Murmur,  Respiratory: good respiratory effort, Bilateral Air entry equal and Decreased, no Crackles, no  wheezes Abdomen: Bowel Sound present, Soft and no tenderness,  Skin: no rashes Extremities: no Pedal edema, no calf tenderness Neurologic: without any new focal findings Gait not checked due to patient safety concerns  Vitals:   01/27/22 0820 01/27/22 1142 01/27/22 1345 01/27/22 1400  BP: (!) 148/69 (!) 153/75 125/62 131/68  Pulse: 76  80 80  Resp: 16 16 19 18   Temp: 99.6 F (37.6 C) 98.7 F (37.1 C) 98.5 F (36.9 C)   TempSrc: Oral Oral    SpO2: 94% 93% 97% 91%  Weight:  74.8 kg    Height:  5\' 5"  (1.651 m)      Intake/Output Summary (Last 24 hours) at 01/27/2022 1410 Last data filed at 01/27/2022 1347 Gross per 24 hour  Intake 1640 ml  Output --  Net 1640 ml   Filed Weights   01/25/22 0340 01/27/22 1142  Weight: 74.8 kg 74.8 kg    Data Reviewed: I have personally reviewed and interpreted daily labs, tele strips, imagings as discussed above. I reviewed all nursing notes, pharmacy notes, vitals, pertinent old records I have discussed plan of care as described above with RN and patient/family.  CBC: Recent Labs  Lab 01/24/22 1826 01/25/22 0515 01/26/22 0448 01/27/22 0340  WBC 11.0* 20.1* 9.2 5.9  NEUTROABS 10.6*  --   --   --   HGB 11.5* 9.0* 8.6* 8.3*  HCT 35.0* 27.3* 26.5* 25.7*  MCV 96.7 96.1 98.9 95.5  PLT 173 152 127* 254*   Basic Metabolic Panel: Recent Labs  Lab 01/24/22 1826 01/25/22 0515 01/26/22 0448 01/27/22 0340  NA 137 139 142 143  K 4.2 4.6 4.2 3.8  CL 105 113* 117* 113*  CO2 19* 21* 17* 22  GLUCOSE 144* 146* 89 90  BUN 28* 25* 27* 18  CREATININE 1.36* 1.17* 1.34* 1.10*  CALCIUM 9.2 8.0* 7.7* 7.9*  MG  --   --  1.7 1.6*  PHOS  --   --  2.4* 3.6    Studies: No results found.  Scheduled Meds:  [MAR Hold] folic acid  1 mg Oral Daily   [MAR Hold] sodium bicarbonate  650 mg Oral TID   [MAR Hold] sodium chloride flush  3 mL Intravenous Q12H   Continuous Infusions:  sodium chloride 75 mL/hr at 01/27/22 0454   [MAR Hold] meropenem  (MERREM) IV 1 g (01/27/22 0835)   PRN Meds: [MAR Hold] acetaminophen, [MAR Hold] albuterol, fentaNYL (SUBLIMAZE) injection, [MAR Hold] hydrALAZINE, [MAR Hold]  morphine injection, [MAR Hold] ondansetron (ZOFRAN) IV, ondansetron (ZOFRAN) IV  Time spent: 40 minutes  Author: Val Riles. MD [facility] 01/27/2022 2:10 PM  To reach On-call, see care teams to locate the attending and reach out to them via [email or web address]. If 7PM-7AM, please contact night-coverage If you still have difficulty reaching the attending provider, please page the [facility] (Director on Call) for [facility] on amion for assistance.

## 2022-01-27 NOTE — Care Management Important Message (Signed)
Important Message  Patient Details  Name: Ana Peterson MRN: [id number] Date of Birth: 02-13-1945   Medicare Important Message Given:  N/A - LOS <3 / Initial given by admissions     [name] 01/27/2022, 9:27 AM

## 2022-01-27 NOTE — Anesthesia Postprocedure Evaluation (Signed)
Anesthesia Post Note  Patient: Ana Peterson  Procedure(s) Performed: XI ROBOTIC ASSISTED LAPAROSCOPIC CHOLECYSTECTOMY INDOCYANINE GREEN FLUORESCENCE IMAGING (ICG)  Patient location during evaluation: PACU Anesthesia Type: General Level of consciousness: awake Pain management: pain level controlled Vital Signs Assessment: post-procedure vital signs reviewed and stable Respiratory status: spontaneous breathing Cardiovascular status: stable Anesthetic complications: no  No notable events documented.   Last Vitals:  Vitals:   01/27/22 1345 01/27/22 1400  BP: 125/62 131/68  Pulse: 80 80  Resp: 19 18  Temp: 36.9 C   SpO2: 97% 91%    Last Pain:  Vitals:   01/27/22 1345  TempSrc:   PainSc: 0-No pain                 VAN STAVEREN,Junko Ohagan

## 2022-01-27 NOTE — Transfer of Care (Signed)
Immediate Anesthesia Transfer of Care Note  Patient: Ana Peterson  Procedure(s) Performed: XI ROBOTIC ASSISTED LAPAROSCOPIC CHOLECYSTECTOMY INDOCYANINE GREEN FLUORESCENCE IMAGING (ICG)  Patient Location: PACU  Anesthesia Type:General  Level of Consciousness: awake, drowsy, and patient cooperative  Airway & Oxygen Therapy: Patient Spontanous Breathing and Patient connected to face mask oxygen  Post-op Assessment: Report given to RN and Post -op Vital signs reviewed and stable  Post vital signs: Reviewed and stable  Last Vitals:  Vitals Value Taken Time  BP 125/62 01/27/22 1345  Temp    Pulse 79 01/27/22 1348  Resp 18 01/27/22 1348  SpO2 98 % 01/27/22 1348  Vitals shown include unvalidated device data.  Last Pain:  Vitals:   01/27/22 1142  TempSrc: Oral  PainSc: 0-No pain      Patients Stated Pain Goal: 0 (09/32/67 1245)  Complications: No notable events documented.

## 2022-01-27 NOTE — Anesthesia Postprocedure Evaluation (Signed)
Anesthesia Post Note  Patient: Ana Peterson  Procedure(s) Performed: ENDOSCOPIC RETROGRADE CHOLANGIOPANCREATOGRAPHY (ERCP)  Patient location during evaluation: Endoscopy Anesthesia Type: General Level of consciousness: awake and alert Pain management: pain level controlled Vital Signs Assessment: post-procedure vital signs reviewed and stable Respiratory status: spontaneous breathing, nonlabored ventilation, respiratory function stable and patient connected to nasal cannula oxygen Cardiovascular status: blood pressure returned to baseline and stable Postop Assessment: no apparent nausea or vomiting Anesthetic complications: no  No notable events documented.   Last Vitals:  Vitals:   01/27/22 1415 01/27/22 1444  BP: 132/68 125/69  Pulse: 77 74  Resp: (!) 22 16  Temp: 36.4 C 37.1 C  SpO2: 93% 94%    Last Pain:  Vitals:   01/27/22 1415  TempSrc:   PainSc: 0-No pain                 Dimas Millin

## 2022-01-27 NOTE — Progress Notes (Signed)
Mobility Specialist - Progress Note   01/27/22 1112  Mobility  Activity Ambulated independently in hallway  Level of Assistance Independent  Assistive Device None  Distance Ambulated (ft) 100 ft  Activity Response Tolerated well  $Mobility charge 1 Mobility   Pt supine upon entry, utilizing RA. Pt completed bed mob, STS and amb indep. Pt amb 100 ft in the hallway without AD, no SOB or LOB noticed. Pt returned to the room, left sitting EOB with needs within reach. Family members present at bedside.   Ana Peterson Mobility Specialist 01/27/22 11:14 AM

## 2022-01-27 NOTE — Plan of Care (Signed)

## 2022-01-28 DIAGNOSIS — K851 Biliary acute pancreatitis without necrosis or infection: Secondary | ICD-10-CM | POA: Diagnosis not present

## 2022-01-28 LAB — BASIC METABOLIC PANEL
Anion gap: 7 (ref 5–15)
BUN: 17 mg/dL (ref 8–23)
CO2: 22 mmol/L (ref 22–32)
Calcium: 8.3 mg/dL — ABNORMAL LOW (ref 8.9–10.3)
Chloride: 111 mmol/L (ref 98–111)
Creatinine, Ser: 1.09 mg/dL — ABNORMAL HIGH (ref 0.44–1.00)
GFR, Estimated: 52 mL/min — ABNORMAL LOW (ref >60)
Glucose, Bld: 138 mg/dL — ABNORMAL HIGH (ref 70–99)
Potassium: 4.3 mmol/L (ref 3.5–5.1)
Sodium: 140 mmol/L (ref 135–145)

## 2022-01-28 LAB — HEPATIC FUNCTION PANEL
ALT: 71 U/L — ABNORMAL HIGH (ref 0–44)
AST: 45 U/L — ABNORMAL HIGH (ref 15–41)
Albumin: 2.9 g/dL — ABNORMAL LOW (ref 3.5–5.0)
Alkaline Phosphatase: 71 U/L (ref 38–126)
Bilirubin, Direct: 0.2 mg/dL (ref 0.0–0.2)
Indirect Bilirubin: 0.9 mg/dL (ref 0.3–0.9)
Total Bilirubin: 1.1 mg/dL (ref 0.3–1.2)
Total Protein: 6.2 g/dL — ABNORMAL LOW (ref 6.5–8.1)

## 2022-01-28 LAB — GLUCOSE, CAPILLARY
Glucose-Capillary: 112 mg/dL — ABNORMAL HIGH (ref 70–99)
Glucose-Capillary: 117 mg/dL — ABNORMAL HIGH (ref 70–99)
Glucose-Capillary: 151 mg/dL — ABNORMAL HIGH (ref 70–99)

## 2022-01-28 LAB — MAGNESIUM: Magnesium: 2 mg/dL (ref 1.7–2.4)

## 2022-01-28 LAB — CBC
HCT: 28.3 % — ABNORMAL LOW (ref 36.0–46.0)
Hemoglobin: 9.4 g/dL — ABNORMAL LOW (ref 12.0–15.0)
MCH: 31.8 pg (ref 26.0–34.0)
MCHC: 33.2 g/dL (ref 30.0–36.0)
MCV: 95.6 fL (ref 80.0–100.0)
Platelets: 131 10*3/uL — ABNORMAL LOW (ref 150–400)
RBC: 2.96 MIL/uL — ABNORMAL LOW (ref 3.87–5.11)
RDW: 11.9 % (ref 11.5–15.5)
WBC: 10.8 10*3/uL — ABNORMAL HIGH (ref 4.0–10.5)
nRBC: 0 % (ref 0.0–0.2)

## 2022-01-28 LAB — LIPASE, BLOOD: Lipase: 34 U/L (ref 11–51)

## 2022-01-28 LAB — PHOSPHORUS: Phosphorus: 3 mg/dL (ref 2.5–4.6)

## 2022-01-28 LAB — SURGICAL PATHOLOGY

## 2022-01-28 MED ORDER — FOLIC ACID 1 MG PO TABS: 1.0000 mg | ORAL_TABLET | Freq: Every day | ORAL | 0 refills | Status: AC

## 2022-01-28 MED ORDER — ERTAPENEM IV (FOR PTA / DISCHARGE USE ONLY): 1.0000 g | INTRAVENOUS | 0 refills | Status: AC

## 2022-01-28 MED ORDER — VITAMIN B-12 1000 MCG PO TABS
500.0000 ug | ORAL_TABLET | Freq: Every day | ORAL | Status: DC
  Administered 2022-01-28: 500 ug via ORAL
  Filled 2022-01-28: qty 1

## 2022-01-28 MED ORDER — ACETAMINOPHEN 325 MG PO TABS: 650.0000 mg | ORAL_TABLET | Freq: Four times a day (QID) | ORAL | 0 refills | Status: AC | PRN

## 2022-01-28 NOTE — Treatment Plan (Addendum)
Diagnosis: ESBL e.coli bacteremia Ascending cholangitis  Baseline Creatinine 1.09    No Known Allergies  OPAT Orders Discharge antibiotics: Ertapenem  1 gram IV every 24 hrs    End Date: 02/03/22   [facility] /Midline Care Per Protocol:  Labs weekly while on IV antibiotics: _X_ CBC with differential  _X_ CMP   _X_ Please pull PIC on 02/04/22   Fax weekly lab results  promptly to [phone]  Clinic Follow Up Appt:not needed with Dr. Delaine Lame   Call [phone] with any questions

## 2022-01-28 NOTE — Progress Notes (Signed)
[facility] Hospital Day(s): 4.   Post op day(s): 1 Day Post-Op.   Interval History: Patient seen and examined, no acute events or new complaints overnight.    Vital signs in last 24 hours: [min-max] current  Temp:  [97.6 F (36.4 C)-98.8 F (37.1 C)] 98.1 F (36.7 C) (01/05 0745) Pulse Rate:  [60-80] 63 (01/05 0745) Resp:  [15-22] 15 (01/05 0745) BP: (125-162)/(62-72) 162/72 (01/05 0745) SpO2:  [91 %-98 %] 98 % (01/05 0745)     Height: 5\' 5"  (165.1 cm) Weight: 74.8 kg BMI (Calculated): 27.44   Intake/Output last 2 shifts:  01/04 0701 - 01/05 0700 In: 1810.4 [P.O.:340; I.V.:1170.4; IV Piggyback:300] Out: -    Physical Exam:  Constitutional: alert, cooperative and no distress  Respiratory: breathing non-labored at rest  Cardiovascular: regular rate and sinus rhythm  Gastrointestinal: soft, non-tender, and non-distended Integumentary: incisions c/d/I.   Labs:     Latest Ref Rng & Units 01/28/2022    6:29 AM 01/27/2022    3:40 AM 01/26/2022    4:48 AM  CBC  WBC 4.0 - 10.5 K/uL 10.8  5.9  9.2   Hemoglobin 12.0 - 15.0 g/dL 9.4  8.3  8.6   Hematocrit 36.0 - 46.0 % 28.3  25.7  26.5   Platelets 150 - 400 K/uL 131  130  127       Latest Ref Rng & Units 01/28/2022    6:29 AM 01/27/2022    3:40 AM 01/26/2022    4:48 AM  CMP  Glucose 70 - 99 mg/dL 138  90  89   BUN 8 - 23 mg/dL 17  18  27    Creatinine 0.44 - 1.00 mg/dL 1.09  1.10  1.34   Sodium 135 - 145 mmol/L 140  143  142   Potassium 3.5 - 5.1 mmol/L 4.3  3.8  4.2   Chloride 98 - 111 mmol/L 111  113  117   CO2 22 - 32 mmol/L 22  22  17    Calcium 8.9 - 10.3 mg/dL 8.3  7.9  7.7   Total Protein 6.5 - 8.1 g/dL 6.2  5.6  5.8   Total Bilirubin 0.3 - 1.2 mg/dL 1.1  0.9  1.5   Alkaline Phos 38 - 126 U/L 71  73  66   AST 15 - 41 U/L 45  58  108   ALT 0 - 44 U/L 71  91  133      Imaging studies: No new pertinent imaging studies   Assessment/Plan:  77 y.o. female with  1 Day Post-Op s/p  robotic cholecystectomy for calculus cholecystitis, complicated by pertinent comorbidities including :  Patient Active Problem List   Diagnosis Date Noted   Ascending cholangitis 01/26/2022   Bacteremia due to Escherichia coli 01/26/2022   Infection due to ESBL-producing Escherichia coli 01/26/2022   Choledocholithiasis 01/25/2022   Pancreatitis due to biliary obstruction 01/24/2022    - WBC up likely reactive from OR>    - ADAT.     - May proceed with d/h.    - Abx as planned per ID.      -- Ronny Bacon, M.D., [facility] 01/28/2022

## 2022-01-28 NOTE — Progress Notes (Signed)
Spoke with Val Riles MD, okay to place a midline for patient's antibiotic therapy for 6 more days, ending 1/11.

## 2022-01-28 NOTE — Discharge Summary (Incomplete)
[facility] Discharge Summary   Patient: Ana Peterson ZOX:[id number]  PCP: Sandrea Hughs, NP  Date of admission: 01/24/2022   Date of discharge:  ***01/28/2022     Discharge Diagnoses:  Principal diagnosis *** Principal Problem:   Pancreatitis due to biliary obstruction Active Problems:   Choledocholithiasis   Ascending cholangitis   Bacteremia due to Escherichia coli   Infection due to ESBL-producing Escherichia coli   Admitted From: *** Disposition:  {comingfrom:22515} ***  Recommendations for Outpatient Follow-up:  PCP: *** Follow up LABS/TEST:  ***   Follow-up Information     Sandrea Hughs, NP Follow up.   Specialty: Nurse Practitioner Contact information: [address] [place] [address] [phone]                Diet recommendation: {dietplan:22518}  Activity: The patient is advised to gradually reintroduce usual activities, as tolerated  Discharge Condition: stable  Code Status: {Palliative Code status:23503}   History of present illness: As per the H and P dictated on admission, "***"  Hospital Course:  *** Summary of her active problems in the hospital is as following.   *** Body mass index is 27.44 kg/m.    Nutrition Interventions:        ***Pain control  - [place] Controlled Substance Reporting System database was reviewed. - *** day supply was provided. - Patient was instructed, not to drive, operate heavy machinery, perform activities at heights, swimming or participation in water activities or provide baby sitting services while on Pain, Sleep and Anxiety Medications; until her outpatient Physician has advised to do so again.  - Also recommended to not to take more than prescribed Pain, Sleep and Anxiety Medications.  ***Patient was ambulatory without any assistance. ***Patient was seen by physical therapy, who recommended {d/c therapy plan:22521}, ***which was arranged. On the day of the discharge  the patient's vitals were stable, and no other acute medical condition were reported by patient. the patient was felt safe to be discharge at {comingfrom:22515} with {d/c therapy plan:22521}.  Consultants: *** Procedures: ***  Discharge Exam: General: Appear in {DEGREE - MILD, MOD, SEV:22033} distress, *** Rash; Oral Mucosa {oral exam:22516}. Cardiovascular: S1 and S2 Present, *** Murmur, Respiratory: ***normal respiratory effort, Bilateral Air entry present and *** Crackles, *** wheezes Abdomen: Bowel Sound ***sent, Soft and *** tenderness, *** hernia Extremities: *** Pedal edema, *** calf tenderness Neurology: {EXAM; NEURO ALERTNESS:23097} and {EXAM; NEURO PED ORIENTATION:18734} affect {Exam; psychiatric affect:30301}.  Filed Weights   01/25/22 0340 01/27/22 1142  Weight: 74.8 kg 74.8 kg   Vitals:   01/28/22 0446 01/28/22 0745  BP: (!) 159/72 (!) 162/72  Pulse: 60 63  Resp: 16 15  Temp: 98.8 F (37.1 C) 98.1 F (36.7 C)  SpO2: 97% 98%    DISCHARGE MEDICATION: Allergies as of 01/28/2022   No Known Allergies      Medication List     TAKE these medications    acetaminophen 325 MG tablet Commonly known as: TYLENOL Take 2 tablets (650 mg total) by mouth every 6 (six) hours as needed for up to 7 days for mild pain, moderate pain, fever or headache.   Allegra Allergy 180 MG tablet Generic drug: fexofenadine Take 180 mg by mouth daily.   cyanocobalamin 1000 MCG tablet Commonly known as: VITAMIN B12 Take 1,000 mcg by mouth daily.   ertapenem  IVPB Commonly known as: INVANZ Inject 1 g into the vein daily for 6 days. Indication:  ESBL E coli bacteremia First Dose:  Yes (received meropenem) Last Day of Therapy:  02/03/2022 Labs - Once weekly:  CBC/D and CMP, Please pull PICC/midline on 02/04/22 Fax weekly lab results  promptly to [phone] Method of administration: Mini-Bag Plus / Gravity Method of administration may be changed at the discretion of home infusion  pharmacist based upon assessment of the patient and/or caregiver's ability to self-administer the medication ordered.   fluticasone 50 MCG/ACT nasal spray Commonly known as: FLONASE Place 1 spray into both nostrils daily.   folic acid 1 MG tablet Commonly known as: FOLVITE Take 1 tablet (1 mg total) by mouth daily. Start taking on: January 29, 2022   lisinopril 40 MG tablet Commonly known as: ZESTRIL Take 40 mg by mouth daily.   meclizine 25 MG tablet Commonly known as: ANTIVERT Take 25 mg by mouth 3 (three) times daily as needed.   Vitamin D3 50 MCG (2000 UT) capsule Take 2,000 Units by mouth daily.               Discharge Care Instructions  (From admission, onward)           Start     Ordered   01/28/22 0000  Change dressing on IV access line weekly and PRN  (Home infusion instructions - [facility] )        01/28/22 1452           No Known Allergies Discharge Instructions     [facility] pharmacist to adjust dose for Vancomycin, Aminoglycosides and other anti-infective therapies as requested by physician.   Complete by: As directed    [facility] to provide Cath Flo 2mg    Complete by: As directed    Administer for PICC line occlusion and as ordered by physician for other access device issues.   Anaphylaxis Kit: Provided to treat any anaphylactic reaction to the medication being provided to the patient if First Dose or when requested by physician   Complete by: As directed    Epinephrine 1mg /ml vial / amp: Administer 0.3mg  (0.107ml) subcutaneously once for moderate to severe anaphylaxis, nurse to call physician and pharmacy when reaction occurs and call 911 if needed for immediate care   Diphenhydramine 50mg /ml IV vial: Administer 25-50mg  IV/IM PRN for first dose reaction, rash, itching, mild reaction, nurse to call physician and pharmacy when reaction occurs   Sodium Chloride 0.9% NS IV: Administer if needed for  hypovolemic blood pressure drop or as ordered by physician after call to physician with anaphylactic reaction   Call MD for:  difficulty breathing, headache or visual disturbances   Complete by: As directed    Call MD for:  extreme fatigue   Complete by: As directed    Call MD for:  persistant dizziness or light-headedness   Complete by: As directed    Call MD for:  persistant nausea and vomiting   Complete by: As directed    Call MD for:  severe uncontrolled pain   Complete by: As directed    Call MD for:  temperature >100.4   Complete by: As directed    Change dressing on IV access line weekly and PRN   Complete by: As directed    Diet - low sodium heart healthy   Complete by: As directed    Discharge instructions   Complete by: As directed    Follow with PCP in 1 week, continue IV antibiotics till 02/03/2022, remove midline on 02/04/2022 Follow-up with general surgery in 1 to 2  weeks for postop check Repeat CBC and CMP after 1 week and fax to ID   Flush IV access with Sodium Chloride 0.9% and Heparin 10 units/ml or 100 units/ml   Complete by: As directed    Home infusion instructions - [facility]   Complete by: As directed    Instructions: Flush IV access with Sodium Chloride 0.9% and Heparin 10units/ml or 100units/ml   Change dressing on IV access line: Weekly and PRN   Instructions Cath Flo 2mg : Administer for PICC Line occlusion and as ordered by physician for other access device   [facility] pharmacist to adjust dose for: Vancomycin, Aminoglycosides and other anti-infective therapies as requested by physician   Increase activity slowly   Complete by: As directed    Method of administration may be changed at the discretion of home infusion pharmacist based upon assessment of the patient and/or caregiver's ability to self-administer the medication ordered   Complete by: As directed    No wound care   Complete by: As directed        The results of  significant diagnostics from this hospitalization (including imaging, microbiology, ancillary and laboratory) are listed below for reference.    Significant Diagnostic Studies: DG C-Arm 1-60 Min-No Report  Result Date: 01/25/2022 Fluoroscopy was utilized by the requesting physician.  No radiographic interpretation.   MR ABDOMEN MRCP WO CONTRAST  Result Date: 01/25/2022 CLINICAL DATA:  Abdominal pain with cholelithiasis. Biliary duct dilatation on recent ultrasound. EXAM: MRI ABDOMEN WITHOUT CONTRAST  (INCLUDING MRCP) TECHNIQUE: Multiplanar multisequence MR imaging of the abdomen was performed. Heavily T2-weighted images of the biliary and pancreatic ducts were obtained, and three-dimensional MRCP images were rendered by post processing. COMPARISON:  Abdomen/pelvis CT 01/24/2022. Abdominal ultrasound exam 01/24/2022. FINDINGS: Motion degraded study secondary to limitations in reproducible breath holding. Lower chest: No pleural effusion. Hepatobiliary: No suspicious focal abnormality in the liver on this study without intravenous contrast. Multiple gallstones are evident measuring in the 10-11 mm size range (see coronal haste image 14 of series 3). Common bile duct diameter measures up to 18 mm. There is a relatively large tubular shaped filling defect in the common duct/common bile duct measuring 1.1 x 0.9 x 5.1 cm (see coronal T2 image 17 of series 3 and 3D MRCP image 38 of series 17). Review of the previous CT shows increased attenuation in the common duct but no calcification despite calcification of the stones in the gallbladder lumen. Pre contrast T1 imaging of the duct is nondiagnostic due to motion artifact. There is probably at 3-4 mm stone distal to the dominant filling defect near the ampulla. Pancreas: No focal mass lesion. No dilatation of the main duct. No intraparenchymal cyst. No peripancreatic edema. Spleen:  No splenomegaly. No focal mass lesion. Adrenals/Urinary Tract: No adrenal nodule or  mass. Kidneys unremarkable. Stomach/Bowel: Stomach is unremarkable. No gastric wall thickening. No evidence of outlet obstruction. Duodenum is normally positioned as is the ligament of Treitz. No small bowel or colonic dilatation within the visualized abdomen. Colonic diverticulosis evident. Vascular/Lymphatic: No abdominal aortic aneurysm. No abdominal lymphadenopathy. Other:  No intraperitoneal free fluid. Musculoskeletal: No suspicious marrow signal abnormality. IMPRESSION: 1. Motion degraded study secondary to limitations in reproducible breath holding. 2. 1.1 x 0.9 x 5.1 cm tubular shaped filling defect in the common duct/common bile duct. Review of the previous CT shows increased attenuation in the common duct lumen but no calcification despite calcification of the stones in the gallbladder lumen. This tubular structure  has a cast-like appearance and may be an unusual noncalcified ductal stone. Intraluminal blood clot would be a consideration although motion degradation on T1 imaging hinders assessment. Given the well-defined margins and low signal intensity on T2 imaging, tumefactive sludge is not considered likely. There may be a 3-4 mm common bile duct stone distal to the dominant filling defect near the ampulla. Consider ERCP to further evaluate. 3. Cholelithiasis. 4. Colonic diverticulosis. Electronically Signed   By: Misty Stanley M.D.   On: 01/25/2022 06:21   MR 3D Recon At Scanner  Result Date: 01/25/2022 CLINICAL DATA:  Abdominal pain with cholelithiasis. Biliary duct dilatation on recent ultrasound. EXAM: MRI ABDOMEN WITHOUT CONTRAST  (INCLUDING MRCP) TECHNIQUE: Multiplanar multisequence MR imaging of the abdomen was performed. Heavily T2-weighted images of the biliary and pancreatic ducts were obtained, and three-dimensional MRCP images were rendered by post processing. COMPARISON:  Abdomen/pelvis CT 01/24/2022. Abdominal ultrasound exam 01/24/2022. FINDINGS: Motion degraded study secondary to  limitations in reproducible breath holding. Lower chest: No pleural effusion. Hepatobiliary: No suspicious focal abnormality in the liver on this study without intravenous contrast. Multiple gallstones are evident measuring in the 10-11 mm size range (see coronal haste image 14 of series 3). Common bile duct diameter measures up to 18 mm. There is a relatively large tubular shaped filling defect in the common duct/common bile duct measuring 1.1 x 0.9 x 5.1 cm (see coronal T2 image 17 of series 3 and 3D MRCP image 38 of series 17). Review of the previous CT shows increased attenuation in the common duct but no calcification despite calcification of the stones in the gallbladder lumen. Pre contrast T1 imaging of the duct is nondiagnostic due to motion artifact. There is probably at 3-4 mm stone distal to the dominant filling defect near the ampulla. Pancreas: No focal mass lesion. No dilatation of the main duct. No intraparenchymal cyst. No peripancreatic edema. Spleen:  No splenomegaly. No focal mass lesion. Adrenals/Urinary Tract: No adrenal nodule or mass. Kidneys unremarkable. Stomach/Bowel: Stomach is unremarkable. No gastric wall thickening. No evidence of outlet obstruction. Duodenum is normally positioned as is the ligament of Treitz. No small bowel or colonic dilatation within the visualized abdomen. Colonic diverticulosis evident. Vascular/Lymphatic: No abdominal aortic aneurysm. No abdominal lymphadenopathy. Other:  No intraperitoneal free fluid. Musculoskeletal: No suspicious marrow signal abnormality. IMPRESSION: 1. Motion degraded study secondary to limitations in reproducible breath holding. 2. 1.1 x 0.9 x 5.1 cm tubular shaped filling defect in the common duct/common bile duct. Review of the previous CT shows increased attenuation in the common duct lumen but no calcification despite calcification of the stones in the gallbladder lumen. This tubular structure has a cast-like appearance and may be an  unusual noncalcified ductal stone. Intraluminal blood clot would be a consideration although motion degradation on T1 imaging hinders assessment. Given the well-defined margins and low signal intensity on T2 imaging, tumefactive sludge is not considered likely. There may be a 3-4 mm common bile duct stone distal to the dominant filling defect near the ampulla. Consider ERCP to further evaluate. 3. Cholelithiasis. 4. Colonic diverticulosis. Electronically Signed   By: Misty Stanley M.D.   On: 01/25/2022 06:21   US Abdomen Limited RUQ (LIVER/GB)  Result Date: 01/24/2022 CLINICAL DATA:  Right upper quadrant pain EXAM: ULTRASOUND ABDOMEN LIMITED RIGHT UPPER QUADRANT COMPARISON:  CT 01/24/2022 FINDINGS: Gallbladder: Multiple gallstones. Normal wall thickness. Negative sonographic Murphy. Common bile duct: Diameter: 16 mm Liver: No focal lesion identified. Within normal limits in parenchymal echogenicity. Portal  vein is patent on color Doppler imaging with normal direction of blood flow towards the liver. Other: None. IMPRESSION: Cholelithiasis without sonographic evidence for acute cholecystitis. Dilated common bile duct measuring 16 mm, correlate with LFTs and consider correlation with MRCP. Electronically Signed   By: Donavan Foil M.D.   On: 01/24/2022 20:28   CT ABDOMEN PELVIS W CONTRAST  Result Date: 01/24/2022 CLINICAL DATA:  Mid epigastric pain and vomiting. EXAM: CT ABDOMEN AND PELVIS WITH CONTRAST TECHNIQUE: Multidetector CT imaging of the abdomen and pelvis was performed using the standard protocol following bolus administration of intravenous contrast. RADIATION DOSE REDUCTION: This exam was performed according to the departmental dose-optimization program which includes automated exposure control, adjustment of the mA and/or kV according to patient size and/or use of iterative reconstruction technique. CONTRAST:  78mL OMNIPAQUE IOHEXOL 300 MG/ML  SOLN COMPARISON:  None Available. FINDINGS: Lower chest:  Mild atelectasis is seen within the bilateral lung bases. Hepatobiliary: No focal liver abnormality is seen. Multiple 9 mm and 10 mm gallstones are seen within the dependent portion of a moderately distended gallbladder. There is no evidence of gallbladder wall thickening or pericholecystic inflammation. The common bile duct is dilated and measures 14 mm in diameter. Pancreas: Unremarkable. No pancreatic ductal dilatation or surrounding inflammatory changes. Spleen: Normal in size without focal abnormality. Adrenals/Urinary Tract: Adrenal glands are unremarkable. Kidneys are normal, without renal calculi, focal lesion, or hydronephrosis. A small amount of air is seen within the lumen of a poorly distended urinary bladder. Stomach/Bowel: Stomach is within normal limits. Appendix appears normal. No evidence of bowel wall thickening, distention, or inflammatory changes. Noninflamed diverticula are seen throughout the large bowel. Vascular/Lymphatic: Aortic atherosclerosis. No enlarged abdominal or pelvic lymph nodes. Reproductive: A 2.2 cm x 2.0 cm x 2.2 cm heterogeneous uterine fibroid is seen along the anterolateral aspect of the uterine fundus on the right. A 0.9 cm x 0.7 cm x 0.8 cm hyperdense focus is seen within the endometrium. The bilateral adnexa are unremarkable. Other: No abdominal wall hernia or abnormality. No abdominopelvic ascites. Musculoskeletal: No acute or significant osseous findings. IMPRESSION: 1. Cholelithiasis. 2. Colonic diverticulosis. 3. Small amount of air within the lumen of a poorly distended urinary bladder. This may be secondary to recent catheterization. Correlation with urinalysis is recommended to exclude the presence of an acute cystitis. 4. Uterine fibroid. 5. Hyperdense focus within the endometrium which may represent a small amount of blood products. Correlation with nonemergent pelvic ultrasound is recommended, as a small mass cannot be excluded. 6. Aortic atherosclerosis. Aortic  Atherosclerosis (ICD10-I70.0). Electronically Signed   By: Virgina Norfolk M.D.   On: 01/24/2022 19:34    Microbiology: Recent Results (from the past 240 hour(s))  Resp panel by RT-PCR (RSV, Flu A&B, Covid) Anterior Nasal Swab     Status: None   Collection Time: 01/24/22  4:50 PM   Specimen: Anterior Nasal Swab  Result Value Ref Range Status   SARS Coronavirus 2 by RT PCR NEGATIVE NEGATIVE Final    Comment: (NOTE) SARS-CoV-2 target nucleic acids are NOT DETECTED.  The SARS-CoV-2 RNA is generally detectable in upper respiratory specimens during the acute phase of infection. The lowest concentration of SARS-CoV-2 viral copies this assay can detect is 138 copies/mL. A negative result does not preclude SARS-Cov-2 infection and should not be used as the sole basis for treatment or other patient management decisions. A negative result may occur with  improper specimen collection/handling, submission of specimen other than nasopharyngeal swab, presence of viral  mutation(s) within the areas targeted by this assay, and inadequate number of viral copies(<138 copies/mL). A negative result must be combined with clinical observations, patient history, and epidemiological information. The expected result is Negative.  Fact Sheet for Patients:  BloggerCourse.com  Fact Sheet for Healthcare Providers:  SeriousBroker.it  This test is no t yet approved or cleared by the [place] FDA and  has been authorized for detection and/or diagnosis of SARS-CoV-2 by FDA under an Emergency Use Authorization (EUA). This EUA will remain  in effect (meaning this test can be used) for the duration of the COVID-19 declaration under Section 564(b)(1) of the Act, 21 U.S.C.section 360bbb-3(b)(1), unless the authorization is terminated  or revoked sooner.       Influenza A by PCR NEGATIVE NEGATIVE Final   Influenza B by PCR NEGATIVE NEGATIVE Final    Comment:  (NOTE) The Xpert Xpress SARS-CoV-2/FLU/RSV plus assay is intended as an aid in the diagnosis of influenza from Nasopharyngeal swab specimens and should not be used as a sole basis for treatment. Nasal washings and aspirates are unacceptable for Xpert Xpress SARS-CoV-2/FLU/RSV testing.  Fact Sheet for Patients: BloggerCourse.com  Fact Sheet for Healthcare Providers: SeriousBroker.it  This test is not yet approved or cleared by the [place] FDA and has been authorized for detection and/or diagnosis of SARS-CoV-2 by FDA under an Emergency Use Authorization (EUA). This EUA will remain in effect (meaning this test can be used) for the duration of the COVID-19 declaration under Section 564(b)(1) of the Act, 21 U.S.C. section 360bbb-3(b)(1), unless the authorization is terminated or revoked.     Resp Syncytial Virus by PCR NEGATIVE NEGATIVE Final    Comment: (NOTE) Fact Sheet for Patients: BloggerCourse.com  Fact Sheet for Healthcare Providers: SeriousBroker.it  This test is not yet approved or cleared by the [place] FDA and has been authorized for detection and/or diagnosis of SARS-CoV-2 by FDA under an Emergency Use Authorization (EUA). This EUA will remain in effect (meaning this test can be used) for the duration of the COVID-19 declaration under Section 564(b)(1) of the Act, 21 U.S.C. section 360bbb-3(b)(1), unless the authorization is terminated or revoked.  Performed at [facility], [address], [place], [place] [address]   Urine Culture     Status: Abnormal   Collection Time: 01/24/22  5:37 PM   Specimen: Urine, Clean Catch  Result Value Ref Range Status   Specimen Description   Final    URINE, CLEAN CATCH Performed at [facility], [address] [place], [place] [address]    Special Requests   Final    NONE Performed at [facility], [address] [place], [place] [address]    Culture MULTIPLE SPECIES PRESENT, SUGGEST RECOLLECTION (A)  Final   Report Status 01/26/2022 FINAL  Final  Blood culture (routine x 2)     Status: None (Preliminary result)   Collection Time: 01/24/22  8:22 PM   Specimen: BLOOD  Result Value Ref Range Status   Specimen Description BLOOD RIGHT ASSIST CONTROL  Final   Special Requests   Final    BOTTLES DRAWN AEROBIC AND ANAEROBIC Blood Culture results may not be optimal due to an excessive volume of blood received in culture bottles   Culture   Final    NO GROWTH 4 DAYS Performed at [facility], [address], [place], [place] [address]    Report Status PENDING  Incomplete  Blood culture (routine x 2)  Status: Abnormal   Collection Time: 01/24/22  8:36 PM   Specimen: BLOOD  Result Value Ref Range Status   Specimen Description   Final    BLOOD LEFT ASSIST CONTROL Performed at [facility], [address] [place], [place] [address]    Special Requests   Final    BOTTLES DRAWN AEROBIC AND ANAEROBIC Blood Culture adequate volume Performed at [facility], [address] [place], [place] [address]    Culture  Setup Time   Final    GRAM NEGATIVE RODS ANAEROBIC BOTTLE ONLY Organism ID to follow Performed at [facility], [address] [place], [place] [address]    Culture (A)  Final    ESCHERICHIA COLI Confirmed Extended Spectrum Beta-Lactamase Producer (ESBL).  In bloodstream infections from ESBL organisms, carbapenems are preferred over piperacillin/tazobactam. They are shown to have a lower risk of mortality.    Report Status 01/27/2022 FINAL  Final   Organism ID, Bacteria ESCHERICHIA COLI  Final      Susceptibility   Escherichia coli - MIC*    AMPICILLIN >=32 RESISTANT Resistant     CEFAZOLIN >=64 RESISTANT Resistant     CEFEPIME 4 INTERMEDIATE Intermediate     CEFTAZIDIME RESISTANT Resistant     CEFTRIAXONE >=64  RESISTANT Resistant     CIPROFLOXACIN >=4 RESISTANT Resistant     GENTAMICIN <=1 SENSITIVE Sensitive     IMIPENEM <=0.25 SENSITIVE Sensitive     TRIMETH/SULFA >=320 RESISTANT Resistant     AMPICILLIN/SULBACTAM 4 SENSITIVE Sensitive     PIP/TAZO <=4 SENSITIVE Sensitive     * ESCHERICHIA COLI  Blood Culture ID Panel (Reflexed)     Status: Abnormal   Collection Time: 01/24/22  8:36 PM  Result Value Ref Range Status   Enterococcus faecalis NOT DETECTED NOT DETECTED Final   Enterococcus Faecium NOT DETECTED NOT DETECTED Final   Listeria monocytogenes NOT DETECTED NOT DETECTED Final   Staphylococcus species NOT DETECTED NOT DETECTED Final   Staphylococcus aureus (BCID) NOT DETECTED NOT DETECTED Final   Staphylococcus epidermidis NOT DETECTED NOT DETECTED Final   Staphylococcus lugdunensis NOT DETECTED NOT DETECTED Final   Streptococcus species NOT DETECTED NOT DETECTED Final   Streptococcus agalactiae NOT DETECTED NOT DETECTED Final   Streptococcus pneumoniae NOT DETECTED NOT DETECTED Final   Streptococcus pyogenes NOT DETECTED NOT DETECTED Final   A.calcoaceticus-baumannii NOT DETECTED NOT DETECTED Final   Bacteroides fragilis NOT DETECTED NOT DETECTED Final   Enterobacterales DETECTED (A) NOT DETECTED Final    Comment: Enterobacterales represent a large order of gram negative bacteria, not a single organism. CRITICAL RESULT CALLED TO, READ BACK BY AND VERIFIED WITH: KRISTIN MERRILL PHARMD 1045 01/25/22 HNM    Enterobacter cloacae complex NOT DETECTED NOT DETECTED Final   Escherichia coli DETECTED (A) NOT DETECTED Final    Comment: CRITICAL RESULT CALLED TO, READ BACK BY AND VERIFIED WITH: KRISTIN MERRILL PHARMD 1045 01/25/22 HNM    Klebsiella aerogenes NOT DETECTED NOT DETECTED Final   Klebsiella oxytoca NOT DETECTED NOT DETECTED Final   Klebsiella pneumoniae NOT DETECTED NOT DETECTED Final   Proteus species NOT DETECTED NOT DETECTED Final   Salmonella species NOT DETECTED NOT DETECTED  Final   Serratia marcescens NOT DETECTED NOT DETECTED Final   Haemophilus influenzae NOT DETECTED NOT DETECTED Final   Neisseria meningitidis NOT DETECTED NOT DETECTED Final   Pseudomonas aeruginosa NOT DETECTED NOT DETECTED Final   Stenotrophomonas maltophilia NOT DETECTED NOT DETECTED Final   Candida albicans NOT DETECTED NOT  DETECTED Final   Candida auris NOT DETECTED NOT DETECTED Final   Candida glabrata NOT DETECTED NOT DETECTED Final   Candida krusei NOT DETECTED NOT DETECTED Final   Candida parapsilosis NOT DETECTED NOT DETECTED Final   Candida tropicalis NOT DETECTED NOT DETECTED Final   Cryptococcus neoformans/gattii NOT DETECTED NOT DETECTED Final   CTX-M ESBL DETECTED (A) NOT DETECTED Final    Comment: CRITICAL RESULT CALLED TO, READ BACK BY AND VERIFIED WITH: KRISTIN MERRILL PHARMD 1045 01/25/22 HNM (NOTE) Extended spectrum beta-lactamase detected. Recommend a carbapenem as initial therapy.      Carbapenem resistance IMP NOT DETECTED NOT DETECTED Final   Carbapenem resistance KPC NOT DETECTED NOT DETECTED Final   Carbapenem resistance NDM NOT DETECTED NOT DETECTED Final   Carbapenem resist OXA 48 LIKE NOT DETECTED NOT DETECTED Final   Carbapenem resistance VIM NOT DETECTED NOT DETECTED Final    Comment: Performed at [facility], [address] [place], [place] [address]  Culture, blood (Routine X 2) w Reflex to ID Panel     Status: None (Preliminary result)   Collection Time: 01/26/22  4:48 AM   Specimen: BLOOD RIGHT ARM  Result Value Ref Range Status   Specimen Description BLOOD RIGHT ARM  Final   Special Requests   Final    BOTTLES DRAWN AEROBIC AND ANAEROBIC Blood Culture results may not be optimal due to an inadequate volume of blood received in culture bottles   Culture   Final    NO GROWTH 2 DAYS Performed at [facility], [address] [place], [place] [address]    Report Status PENDING  Incomplete  Culture, blood (Routine X 2) w Reflex  to ID Panel     Status: None (Preliminary result)   Collection Time: 01/26/22  4:48 AM   Specimen: BLOOD LEFT HAND  Result Value Ref Range Status   Specimen Description BLOOD LEFT HAND  Final   Special Requests   Final    BOTTLES DRAWN AEROBIC AND ANAEROBIC Blood Culture results may not be optimal due to an inadequate volume of blood received in culture bottles   Culture   Final    NO GROWTH 2 DAYS Performed at [facility], [address] [place], [place] [address]    Report Status PENDING  Incomplete     Labs: CBC: Recent Labs  Lab 01/24/22 1826 01/25/22 0515 01/26/22 0448 01/27/22 0340 01/28/22 0629  WBC 11.0* 20.1* 9.2 5.9 10.8*  NEUTROABS 10.6*  --   --   --   --   HGB 11.5* 9.0* 8.6* 8.3* 9.4*  HCT 35.0* 27.3* 26.5* 25.7* 28.3*  MCV 96.7 96.1 98.9 95.5 95.6  PLT 173 152 127* 130* 131*   Basic Metabolic Panel: Recent Labs  Lab 01/24/22 1826 01/25/22 0515 01/26/22 0448 01/27/22 0340 01/28/22 0629  NA 137 139 142 143 140  K 4.2 4.6 4.2 3.8 4.3  CL 105 113* 117* 113* 111  CO2 19* 21* 17* 22 22  GLUCOSE 144* 146* 89 90 138*  BUN 28* 25* 27* 18 17  CREATININE 1.36* 1.17* 1.34* 1.10* 1.09*  CALCIUM 9.2 8.0* 7.7* 7.9* 8.3*  MG  --   --  1.7 1.6* 2.0  PHOS  --   --  2.4* 3.6 3.0   Liver Function Tests: Recent Labs  Lab 01/24/22 1826 01/25/22 0515 01/26/22 0448 01/27/22 0340 01/28/22 0629  AST 624* 290* 108* 58* 45*  ALT 239* 207* 133* 91* 71*  ALKPHOS 80 59  66 73 71  BILITOT 1.8* 2.2* 1.5* 0.9 1.1  PROT 7.5 6.1* 5.8* 5.6* 6.2*  ALBUMIN 3.9 2.9* 2.8* 2.7* 2.9*   Recent Labs  Lab 01/24/22 1826 01/26/22 0448 01/27/22 0340 01/28/22 0629  LIPASE 65* 34 39 34   No results for input(s): "AMMONIA" in the last 168 hours. Cardiac Enzymes: No results for input(s): "CKTOTAL", "CKMB", "CKMBINDEX", "TROPONINI" in the last 168 hours. BNP (last 3 results) No results for input(s): "BNP" in the last 8760 hours. CBG: Recent Labs  Lab 01/27/22 0824  01/27/22 1640 01/28/22 0037 01/28/22 0858 01/28/22 1200  GLUCAP 89 121* 151* 112* 117*    Time spent: 35 minutes  Signed:  Gillis Santaileep Alexius Hangartner  [facility]  ***01/28/2022 3:36 PM

## 2022-01-28 NOTE — Progress Notes (Signed)
Discharge instructions reviewed with patient (via interpreter) and daughter including followup visits and new medications.  Understanding was verbalized and all questions were answered.  Patient discharged with midline in place.  Patient discharged home via wheelchair in stable condition escorted by nursing staff.

## 2022-01-28 NOTE — Plan of Care (Signed)
  Problem: Coping: Goal: Ability to adjust to condition or change in health will improve Outcome: Progressing   

## 2022-01-28 NOTE — TOC Progression Note (Signed)
Transition of Care [facility]) - Progression Note    Patient Details  Name: Ana Peterson MRN: [id number] Date of Birth: 28-May-1945  [facility]) CM/SW Contact  Beverly Sessions, RN Phone Number: 01/28/2022, 1:53 PM  Clinical Narrative:      Infusion education has been completed.  Per MD plan for discharge today if PICC is placed [facility] with [facility] an Pam with [employer] notified Medications to be delivered to home tonight, daughter Verdis Frederickson to administer dose tomorrow, and Madelia to provide start of care Sunday at 2pm      Expected Discharge Plan and Services                                               Social Determinants of Health (SDOH) Interventions SDOH Screenings   Food Insecurity: No Food Insecurity (01/25/2022)  Housing: Low Risk  (01/25/2022)  Transportation Needs: No Transportation Needs (01/25/2022)  Utilities: Not At Risk (01/25/2022)  Tobacco Use: Low Risk  (01/27/2022)    Readmission Risk Interventions     No data to display

## 2022-01-28 NOTE — TOC Transition Note (Signed)
Transition of Care [facility]) - CM/SW Discharge Note   Patient Details  Name: Ana Peterson MRN: [id number] Date of Birth: Apr 14, 1945  Transition [facility] Care [facility]) CM/SW Contact:  Beverly Sessions, RN Phone Number: 01/28/2022, 3:39 PM   Clinical Narrative:      Midline placed Discharge in [name] with [name] and [name] with [name] notified of discharge        Patient Goals and CMS Choice      Discharge Placement                         Discharge Plan and Services Additional resources added to the After Visit Summary for                                       Social Determinants of Health (SDOH) Interventions SDOH Screenings   Food Insecurity: No Food Insecurity (01/25/2022)  Housing: Low Risk  (01/25/2022)  Transportation Needs: No Transportation Needs (01/25/2022)  Utilities: Not At Risk (01/25/2022)  Tobacco Use: Low Risk  (01/27/2022)     Readmission Risk Interventions     No data to display

## 2022-01-28 NOTE — Progress Notes (Addendum)
A midline has been ordered for this patient. The [facility]AST RN has reviewed the patient's medical record including any arm restrictions, current creatinine clearance, length IV therapy is needed, and infusions needed/ordered to determine if a midline is the appropriate line for this individual patient. If there are contraindications, the physician and primary RN has been contacted by [facility] RN for further discussion. Midline Education: a midline is a long peripheral IV placed in the upper arm with the tip located at or near the axilla and distal to the shoulder should not be used as a CLABSI preventative measure   it has one lumen only it can remain in place for up to 29 days  Safe for Vancomycin infusion LESS THAN 6 days Is safe for power injection if good blood return can be obtained and line flushes easily it CANNOT be used for continuous infusion of vesicants. Including TPN and chemotherapy Should NOT be placed for sole intent of obtaining labs as there is no guarantee blood can be successfully drawn from line  contraindicated in patients with thrombosis, hypercoagulability, decreased venous flow to the extremities, ESRD (without a nephrologist's approval), small vessels, allergy to polyurethane, or known/suspected presence of a device-related infection, bacteremia, or septicemia    Already have a bruised and swollen RFA from previous PIV. No suitable vein for midline on LUA.

## 2022-01-28 NOTE — Plan of Care (Signed)
  Problem: Education: Goal: Ability to describe self-care measures that may prevent or decrease complications (Diabetes Survival Skills Education) will improve 01/28/2022 1027 by Adline Potter, RN Reactivated 01/28/2022 1027 by Adline Potter, RN Reactivated 01/28/2022 1026 by Adline Potter, RN Outcome: Adequate for Discharge Goal: Individualized Educational Video(s) 01/28/2022 1027 by Adline Potter, RN Reactivated 01/28/2022 1027 by Adline Potter, RN Reactivated 01/28/2022 1026 by Adline Potter, RN Outcome: Adequate for Discharge   Problem: Coping: Goal: Ability to adjust to condition or change in health will improve 01/28/2022 1027 by Adline Potter, RN Reactivated 01/28/2022 1027 by Adline Potter, RN Reactivated 01/28/2022 1026 by Adline Potter, RN Outcome: Adequate for Discharge   Problem: Fluid Volume: Goal: Ability to maintain a balanced intake and output will improve 01/28/2022 1027 by Adline Potter, RN Reactivated 01/28/2022 1027 by Adline Potter, RN Reactivated 01/28/2022 1026 by Adline Potter, RN Outcome: Adequate for Discharge

## 2022-01-28 NOTE — Progress Notes (Addendum)
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 To discharging provider:  please sign these orders via discharge navigator,  Select New Orders & click on the button choice - Manage This Unsigned Work.     Thank you for allowing pharmacy to be a part of this patient's care.  Doreene Eland, PharmD, BCPS, BCIDP Work Cell: [phone] 01/28/2022 12:27 PM

## 2022-01-28 NOTE — TOC Progression Note (Signed)
Transition of Care [facility]) - Progression Note    Patient Details  Name: Ana Peterson MRN: [id number] Date of Birth: Apr 04, 1945  Transition of Care [facility]) CM/SW Contact  Beverly Sessions, RN Phone Number: 01/28/2022, 9:02 AM  Clinical Narrative:    POD 1 cholecystectomy  Pam with [employer] to complete infusion education today with patient and daughter Verdis Frederickson         Expected Discharge Plan and Services                                               Social Determinants of Health (SDOH) Interventions SDOH Screenings   Food Insecurity: No Food Insecurity (01/25/2022)  Housing: Low Risk  (01/25/2022)  Transportation Needs: No Transportation Needs (01/25/2022)  Utilities: Not At Risk (01/25/2022)  Tobacco Use: Low Risk  (01/27/2022)    Readmission Risk Interventions     No data to display

## 2022-01-29 LAB — CULTURE, BLOOD (ROUTINE X 2): Culture: NO GROWTH

## 2022-01-29 NOTE — Discharge Summary (Signed)
[facility] Discharge Summary   Patient: Ana Peterson [id number]  PCP: Freddy Finner, NP  Date of admission: 01/24/2022   Date of discharge: 01/28/2022      Discharge Diagnoses:  Principal Problem:   Pancreatitis due to biliary obstruction Active Problems:   Choledocholithiasis   Ascending cholangitis   Bacteremia due to Escherichia coli   Infection due to ESBL-producing Escherichia coli   Admitted From: Home Disposition:  Home   Recommendations for Outpatient Follow-up:  PCP: in 1 wk F/u general surgery in 1 week for postop check Follow up LABS/TEST: CMP, and CBC after 1 week,  And repeat folic acid and vitamin B12 level after 3 to 6 months   Follow-up Information     Freddy Finner, NP. Go on 02/01/2022.   Specialty: Nurse Practitioner Why: Tuesday, 1/9 at 10 a.m. Contact information: [place] [address] [phone]         Tylene Fantasia, PA-C. Schedule an appointment as soon as possible for a visit on 02/10/2022.   Specialty: Physician Assistant Why: please call on Monday and schedule a visit around 1/18. Contact information: [address] [place] [address] [phone]                Diet recommendation: Cardiac diet  Activity: The patient is advised to gradually reintroduce usual activities, as tolerated  Discharge Condition: stable  Code Status: Full code   History of present illness: As per the H and P dictated on admission Hospital Course:  Ana Peterson is a 77 y.o. female with medical history significant of Hypertension and obesity.  She presented to the emergency room on account of severe abdominal pain, associated with nausea and vomiting. In the ED, workup including CT scan of the abdomen pelvis were done. Showed evidence of cholelithiasis. Common bile duct was noted to be dilated to 14 mm. She was also noted to have evidence of elevated transaminases. Based on findings, this was  concerning for obstructive biliary disease. The ED did discuss case with Dr. Louretta Parma of gastroenterology who recommended MRCP and fractionated bilirubin test. Based upon findings of MRCP, GI team will reevaluate patient for ERCP. Patient was referred to hospitalist service for admission.    Assessment and Plan: # Choledocholithiasis and cholelithiasis Patient presented with abdominal pain, nausea and vomiting, resolved  MRCP positive for CBD filling defect, recommended ERCP GI was consulted, ERCP was done and CBD stone was removed. LFTs improving Started clear liquid diet, advanced to low-fat diet.  Lipase improved, no signs of pancreatitis after the procedure.   # E. coli ESBL bacteremia due to ascending cholangitis. Sepsis due to E. coli ESBL bacteremia Patient was hypotensive and elevated lactic acid level, leukocytosis S/p ceftriaxone which was discontinued and started on meropenem 1 g IV every 12 hours ID consulted, recommended ertapenem 1 g IV daily for 6 additional days on discharge to complete total 10 days of course. 1/3 repeat blood cultures NGTD.  Midline was inserted by IV team and infusion team was arranged by case manager.  Patient was discharged with home set up. # Ascending cholangitis, general surgery was consulted, robotic lap chole was done on 01/27/2022, patient tolerated procedure well.  Patient was cleared by general surgery to discharge and follow-up as an outpatient in 1 week. # Metabolic acidosis, CO2 17, s/p oral bicarbonate supplement.  Bicarb improved. # Hypomagnesemia, Mag repleted.  Resolved  # Hypophosphatemia, mild, phosphorus repleted orally.  Resolved # Acute pancreatitis due to CBD  stone, Resolved after ERCP and Lapchole # Hypertension, blood pressure remained soft. Patient was on lisinopril and HCTZ prn at home which has been held during hospital stay.  Patient was given IV fluid.  BP stabilized.  Resumed medications on discharge, patient was advised to monitor BP  and follow parameters.  Follow with PCP for further management. # Prediabetic, mild hyperglycemia, hemoglobin A1c 6.2, continue diabetic diet.  Follow with PCP for further management as an outpatient.   # Anemia of chronic disease, iron profile within normal range, folic acid 6.5 at lower end and vitamin B12 302, target 400.  Started folic acid and vitamin B12 supplement.  Follow with PCP to repeat folic acid and [id number] level after 3 to 6 months. Body mass index is 27.46 kg/m.  Interventions:   Follow with PCP in 1 week, continue IV antibiotics till 02/03/2022, remove midline on 02/04/2022 Follow-up with general surgery in 1 to 2 weeks for postop check Repeat CBC and CMP after 1 week and fax to ID Fax weekly lab results promptly to [phone]   Patient was ambulatory without any assistance. On the day of the discharge the patient's vitals were stable, and no other acute medical condition were reported by patient. the patient was felt safe to be discharge at Home.  Consultants: GI, general surgery, ID Procedures: ERCP and Lap chole  Discharge Exam: General: Appear in no distress, no Rash; Oral Mucosa Clear, moist. Cardiovascular: S1 and S2 Present, no Murmur, Respiratory: normal respiratory effort, Bilateral Air entry present and no Crackles, no wheezes Abdomen: Bowel Sound present, Soft and mild post-op tenderness, no hernia Extremities: no Pedal edema, no calf tenderness Neurology: alert and oriented to time, place, and person affect appropriate.  Filed Weights   01/25/22 0340 01/27/22 1142  Weight: 74.8 kg 74.8 kg   Vitals:   01/28/22 0745 01/28/22 1537  BP: (!) 162/72 (!) 158/70  Pulse: 63 79  Resp: 15 18  Temp: 98.1 F (36.7 C) 98.1 F (36.7 C)  SpO2: 98% 94%    DISCHARGE MEDICATION: Allergies as of 01/28/2022   No Known Allergies      Medication List     TAKE these medications    acetaminophen 325 MG tablet Commonly known as: TYLENOL Take 2 tablets (650 mg  total) by mouth every 6 (six) hours as needed for up to 7 days for mild pain, moderate pain, fever or headache.   Allegra Allergy 180 MG tablet Generic drug: fexofenadine Take 180 mg by mouth daily.   cyanocobalamin 1000 MCG tablet Commonly known as: VITAMIN B12 Take 1,000 mcg by mouth daily.   ertapenem  IVPB Commonly known as: INVANZ Inject 1 g into the vein daily for 6 days. Indication:  ESBL E coli bacteremia First Dose: Yes (received meropenem) Last Day of Therapy:  02/03/2022 Labs - Once weekly:  CBC/D and CMP, Please pull PICC/midline on 02/04/22 Fax weekly lab results  promptly to [phone] Method of administration: Mini-Bag Plus / Gravity Method of administration may be changed at the discretion of home infusion pharmacist based upon assessment of the patient and/or caregiver's ability to self-administer the medication ordered.   fluticasone 50 MCG/ACT nasal spray Commonly known as: FLONASE Place 1 spray into both nostrils daily.   folic acid 1 MG tablet Commonly known as: FOLVITE Take 1 tablet (1 mg total) by mouth daily.   lisinopril 40 MG tablet Commonly known as: ZESTRIL Take 40 mg by mouth daily.   meclizine 25 MG tablet  Commonly known as: ANTIVERT Take 25 mg by mouth 3 (three) times daily as needed.   Vitamin D3 50 MCG (2000 UT) capsule Take 2,000 Units by mouth daily.               Discharge Care Instructions  (From admission, onward)           Start     Ordered   01/28/22 0000  Change dressing on IV access line weekly and PRN  (Home infusion instructions - [facility] )        01/28/22 1452           No Known Allergies Discharge Instructions     [facility] pharmacist to adjust dose for Vancomycin, Aminoglycosides and other anti-infective therapies as requested by physician.   Complete by: As directed    [facility] to provide Cath Flo 2mg    Complete by: As directed    Administer for PICC line  occlusion and as ordered by physician for other access device issues.   Anaphylaxis Kit: Provided to treat any anaphylactic reaction to the medication being provided to the patient if First Dose or when requested by physician   Complete by: As directed    Epinephrine 1mg /ml vial / amp: Administer 0.3mg  (0.25ml) subcutaneously once for moderate to severe anaphylaxis, nurse to call physician and pharmacy when reaction occurs and call 911 if needed for immediate care   Diphenhydramine 50mg /ml IV vial: Administer 25-50mg  IV/IM PRN for first dose reaction, rash, itching, mild reaction, nurse to call physician and pharmacy when reaction occurs   Sodium Chloride 0.9% NS 578ml IV: Administer if needed for hypovolemic blood pressure drop or as ordered by physician after call to physician with anaphylactic reaction   Call MD for:  difficulty breathing, headache or visual disturbances   Complete by: As directed    Call MD for:  extreme fatigue   Complete by: As directed    Call MD for:  persistant dizziness or light-headedness   Complete by: As directed    Call MD for:  persistant nausea and vomiting   Complete by: As directed    Call MD for:  severe uncontrolled pain   Complete by: As directed    Call MD for:  temperature >100.4   Complete by: As directed    Change dressing on IV access line weekly and PRN   Complete by: As directed    Diet - low sodium heart healthy   Complete by: As directed    Discharge instructions   Complete by: As directed    Follow with PCP in 1 week, continue IV antibiotics till 02/03/2022, remove midline on 02/04/2022 Follow-up with general surgery in 1 to 2 weeks for postop check Repeat CBC and CMP after 1 week and fax to ID   Flush IV access with Sodium Chloride 0.9% and Heparin 10 units/ml or 100 units/ml   Complete by: As directed    Home infusion instructions - [facility]   Complete by: As directed    Instructions: Flush IV access with Sodium Chloride 0.9%  and Heparin 10units/ml or 100units/ml   Change dressing on IV access line: Weekly and PRN   Instructions Cath Flo 2mg : Administer for PICC Line occlusion and as ordered by physician for other access device   [facility] pharmacist to adjust dose for: Vancomycin, Aminoglycosides and other anti-infective therapies as requested by physician   Increase activity slowly   Complete by: As directed  Method of administration may be changed at the discretion of home infusion pharmacist based upon assessment of the patient and/or caregiver's ability to self-administer the medication ordered   Complete by: As directed    No wound care   Complete by: As directed        The results of significant diagnostics from this hospitalization (including imaging, microbiology, ancillary and laboratory) are listed below for reference.    Significant Diagnostic Studies: DG C-Arm 1-60 Min-No Report  Result Date: 01/25/2022 Fluoroscopy was utilized by the requesting physician.  No radiographic interpretation.   MR ABDOMEN MRCP WO CONTRAST  Result Date: 01/25/2022 CLINICAL DATA:  Abdominal pain with cholelithiasis. Biliary duct dilatation on recent ultrasound. EXAM: MRI ABDOMEN WITHOUT CONTRAST  (INCLUDING MRCP) TECHNIQUE: Multiplanar multisequence MR imaging of the abdomen was performed. Heavily T2-weighted images of the biliary and pancreatic ducts were obtained, and three-dimensional MRCP images were rendered by post processing. COMPARISON:  Abdomen/pelvis CT 01/24/2022. Abdominal ultrasound exam 01/24/2022. FINDINGS: Motion degraded study secondary to limitations in reproducible breath holding. Lower chest: No pleural effusion. Hepatobiliary: No suspicious focal abnormality in the liver on this study without intravenous contrast. Multiple gallstones are evident measuring in the 10-11 mm size range (see coronal haste image 14 of series 3). Common bile duct diameter measures up to 18 mm. There is a relatively  large tubular shaped filling defect in the common duct/common bile duct measuring 1.1 x 0.9 x 5.1 cm (see coronal T2 image 17 of series 3 and 3D MRCP image 38 of series 17). Review of the previous CT shows increased attenuation in the common duct but no calcification despite calcification of the stones in the gallbladder lumen. Pre contrast T1 imaging of the duct is nondiagnostic due to motion artifact. There is probably at 3-4 mm stone distal to the dominant filling defect near the ampulla. Pancreas: No focal mass lesion. No dilatation of the main duct. No intraparenchymal cyst. No peripancreatic edema. Spleen:  No splenomegaly. No focal mass lesion. Adrenals/Urinary Tract: No adrenal nodule or mass. Kidneys unremarkable. Stomach/Bowel: Stomach is unremarkable. No gastric wall thickening. No evidence of outlet obstruction. Duodenum is normally positioned as is the ligament of Treitz. No small bowel or colonic dilatation within the visualized abdomen. Colonic diverticulosis evident. Vascular/Lymphatic: No abdominal aortic aneurysm. No abdominal lymphadenopathy. Other:  No intraperitoneal free fluid. Musculoskeletal: No suspicious marrow signal abnormality. IMPRESSION: 1. Motion degraded study secondary to limitations in reproducible breath holding. 2. 1.1 x 0.9 x 5.1 cm tubular shaped filling defect in the common duct/common bile duct. Review of the previous CT shows increased attenuation in the common duct lumen but no calcification despite calcification of the stones in the gallbladder lumen. This tubular structure has a cast-like appearance and may be an unusual noncalcified ductal stone. Intraluminal blood clot would be a consideration although motion degradation on T1 imaging hinders assessment. Given the well-defined margins and low signal intensity on T2 imaging, tumefactive sludge is not considered likely. There may be a 3-4 mm common bile duct stone distal to the dominant filling defect near the ampulla.  Consider ERCP to further evaluate. 3. Cholelithiasis. 4. Colonic diverticulosis. Electronically Signed   By: Misty Stanley M.D.   On: 01/25/2022 06:21   MR 3D Recon At Scanner  Result Date: 01/25/2022 CLINICAL DATA:  Abdominal pain with cholelithiasis. Biliary duct dilatation on recent ultrasound. EXAM: MRI ABDOMEN WITHOUT CONTRAST  (INCLUDING MRCP) TECHNIQUE: Multiplanar multisequence MR imaging of the abdomen was performed. Heavily T2-weighted images of the  biliary and pancreatic ducts were obtained, and three-dimensional MRCP images were rendered by post processing. COMPARISON:  Abdomen/pelvis CT 01/24/2022. Abdominal ultrasound exam 01/24/2022. FINDINGS: Motion degraded study secondary to limitations in reproducible breath holding. Lower chest: No pleural effusion. Hepatobiliary: No suspicious focal abnormality in the liver on this study without intravenous contrast. Multiple gallstones are evident measuring in the 10-11 mm size range (see coronal haste image 14 of series 3). Common bile duct diameter measures up to 18 mm. There is a relatively large tubular shaped filling defect in the common duct/common bile duct measuring 1.1 x 0.9 x 5.1 cm (see coronal T2 image 17 of series 3 and 3D MRCP image 38 of series 17). Review of the previous CT shows increased attenuation in the common duct but no calcification despite calcification of the stones in the gallbladder lumen. Pre contrast T1 imaging of the duct is nondiagnostic due to motion artifact. There is probably at 3-4 mm stone distal to the dominant filling defect near the ampulla. Pancreas: No focal mass lesion. No dilatation of the main duct. No intraparenchymal cyst. No peripancreatic edema. Spleen:  No splenomegaly. No focal mass lesion. Adrenals/Urinary Tract: No adrenal nodule or mass. Kidneys unremarkable. Stomach/Bowel: Stomach is unremarkable. No gastric wall thickening. No evidence of outlet obstruction. Duodenum is normally positioned as is the  ligament of Treitz. No small bowel or colonic dilatation within the visualized abdomen. Colonic diverticulosis evident. Vascular/Lymphatic: No abdominal aortic aneurysm. No abdominal lymphadenopathy. Other:  No intraperitoneal free fluid. Musculoskeletal: No suspicious marrow signal abnormality. IMPRESSION: 1. Motion degraded study secondary to limitations in reproducible breath holding. 2. 1.1 x 0.9 x 5.1 cm tubular shaped filling defect in the common duct/common bile duct. Review of the previous CT shows increased attenuation in the common duct lumen but no calcification despite calcification of the stones in the gallbladder lumen. This tubular structure has a cast-like appearance and may be an unusual noncalcified ductal stone. Intraluminal blood clot would be a consideration although motion degradation on T1 imaging hinders assessment. Given the well-defined margins and low signal intensity on T2 imaging, tumefactive sludge is not considered likely. There may be a 3-4 mm common bile duct stone distal to the dominant filling defect near the ampulla. Consider ERCP to further evaluate. 3. Cholelithiasis. 4. Colonic diverticulosis. Electronically Signed   By: Misty Stanley M.D.   On: 01/25/2022 06:21   US Abdomen Limited RUQ (LIVER/GB)  Result Date: 01/24/2022 CLINICAL DATA:  Right upper quadrant pain EXAM: ULTRASOUND ABDOMEN LIMITED RIGHT UPPER QUADRANT COMPARISON:  CT 01/24/2022 FINDINGS: Gallbladder: Multiple gallstones. Normal wall thickness. Negative sonographic Murphy. Common bile duct: Diameter: 16 mm Liver: No focal lesion identified. Within normal limits in parenchymal echogenicity. Portal vein is patent on color Doppler imaging with normal direction of blood flow towards the liver. Other: None. IMPRESSION: Cholelithiasis without sonographic evidence for acute cholecystitis. Dilated common bile duct measuring 16 mm, correlate with LFTs and consider correlation with MRCP. Electronically Signed   By: Donavan Foil M.D.   On: 01/24/2022 20:28   CT ABDOMEN PELVIS W CONTRAST  Result Date: 01/24/2022 CLINICAL DATA:  Mid epigastric pain and vomiting. EXAM: CT ABDOMEN AND PELVIS WITH CONTRAST TECHNIQUE: Multidetector CT imaging of the abdomen and pelvis was performed using the standard protocol following bolus administration of intravenous contrast. RADIATION DOSE REDUCTION: This exam was performed according to the departmental dose-optimization program which includes automated exposure control, adjustment of the mA and/or kV according to patient size and/or use of iterative  reconstruction technique. CONTRAST:  19mL OMNIPAQUE IOHEXOL 300 MG/ML  SOLN COMPARISON:  None Available. FINDINGS: Lower chest: Mild atelectasis is seen within the bilateral lung bases. Hepatobiliary: No focal liver abnormality is seen. Multiple 9 mm and 10 mm gallstones are seen within the dependent portion of a moderately distended gallbladder. There is no evidence of gallbladder wall thickening or pericholecystic inflammation. The common bile duct is dilated and measures 14 mm in diameter. Pancreas: Unremarkable. No pancreatic ductal dilatation or surrounding inflammatory changes. Spleen: Normal in size without focal abnormality. Adrenals/Urinary Tract: Adrenal glands are unremarkable. Kidneys are normal, without renal calculi, focal lesion, or hydronephrosis. A small amount of air is seen within the lumen of a poorly distended urinary bladder. Stomach/Bowel: Stomach is within normal limits. Appendix appears normal. No evidence of bowel wall thickening, distention, or inflammatory changes. Noninflamed diverticula are seen throughout the large bowel. Vascular/Lymphatic: Aortic atherosclerosis. No enlarged abdominal or pelvic lymph nodes. Reproductive: A 2.2 cm x 2.0 cm x 2.2 cm heterogeneous uterine fibroid is seen along the anterolateral aspect of the uterine fundus on the right. A 0.9 cm x 0.7 cm x 0.8 cm hyperdense focus is seen within the  endometrium. The bilateral adnexa are unremarkable. Other: No abdominal wall hernia or abnormality. No abdominopelvic ascites. Musculoskeletal: No acute or significant osseous findings. IMPRESSION: 1. Cholelithiasis. 2. Colonic diverticulosis. 3. Small amount of air within the lumen of a poorly distended urinary bladder. This may be secondary to recent catheterization. Correlation with urinalysis is recommended to exclude the presence of an acute cystitis. 4. Uterine fibroid. 5. Hyperdense focus within the endometrium which may represent a small amount of blood products. Correlation with nonemergent pelvic ultrasound is recommended, as a small mass cannot be excluded. 6. Aortic atherosclerosis. Aortic Atherosclerosis (ICD10-I70.0). Electronically Signed   By: Virgina Norfolk M.D.   On: 01/24/2022 19:34    Microbiology: Recent Results (from the past 240 hour(s))  Resp panel by RT-PCR (RSV, Flu A&B, Covid) Anterior Nasal Swab     Status: None   Collection Time: 01/24/22  4:50 PM   Specimen: Anterior Nasal Swab  Result Value Ref Range Status   SARS Coronavirus 2 by RT PCR NEGATIVE NEGATIVE Final    Comment: (NOTE) SARS-CoV-2 target nucleic acids are NOT DETECTED.  The SARS-CoV-2 RNA is generally detectable in upper respiratory specimens during the acute phase of infection. The lowest concentration of SARS-CoV-2 viral copies this assay can detect is 138 copies/mL. A negative result does not preclude SARS-Cov-2 infection and should not be used as the sole basis for treatment or other patient management decisions. A negative result may occur with  improper specimen collection/handling, submission of specimen other than nasopharyngeal swab, presence of viral mutation(s) within the areas targeted by this assay, and inadequate number of viral copies(<138 copies/mL). A negative result must be combined with clinical observations, patient history, and epidemiological information. The expected result is  Negative.  Fact Sheet for Patients:  EntrepreneurPulse.com.au  Fact Sheet for Healthcare Providers:  IncredibleEmployment.be  This test is no t yet approved or cleared by the [place] FDA and  has been authorized for detection and/or diagnosis of SARS-CoV-2 by FDA under an Emergency Use Authorization (EUA). This EUA will remain  in effect (meaning this test can be used) for the duration of the COVID-19 declaration under Section 564(b)(1) of the Act, 21 U.S.C.section 360bbb-3(b)(1), unless the authorization is terminated  or revoked sooner.       Influenza A by PCR NEGATIVE NEGATIVE Final  Influenza B by PCR NEGATIVE NEGATIVE Final    Comment: (NOTE) The Xpert Xpress SARS-CoV-2/FLU/RSV plus assay is intended as an aid in the diagnosis of influenza from Nasopharyngeal swab specimens and should not be used as a sole basis for treatment. Nasal washings and aspirates are unacceptable for Xpert Xpress SARS-CoV-2/FLU/RSV testing.  Fact Sheet for Patients: EntrepreneurPulse.com.au  Fact Sheet for Healthcare Providers: IncredibleEmployment.be  This test is not yet approved or cleared by the [place] FDA and has been authorized for detection and/or diagnosis of SARS-CoV-2 by FDA under an Emergency Use Authorization (EUA). This EUA will remain in effect (meaning this test can be used) for the duration of the COVID-19 declaration under Section 564(b)(1) of the Act, 21 U.S.C. section 360bbb-3(b)(1), unless the authorization is terminated or revoked.     Resp Syncytial Virus by PCR NEGATIVE NEGATIVE Final    Comment: (NOTE) Fact Sheet for Patients: EntrepreneurPulse.com.au  Fact Sheet for Healthcare Providers: IncredibleEmployment.be  This test is not yet approved or cleared by the [place] FDA and has been authorized for detection and/or diagnosis of  SARS-CoV-2 by FDA under an Emergency Use Authorization (EUA). This EUA will remain in effect (meaning this test can be used) for the duration of the COVID-19 declaration under Section 564(b)(1) of the Act, 21 U.S.C. section 360bbb-3(b)(1), unless the authorization is terminated or revoked.  Performed at [facility], [address] [place], [place] [address]   Urine Culture     Status: Abnormal   Collection Time: 01/24/22  5:37 PM   Specimen: Urine, Clean Catch  Result Value Ref Range Status   Specimen Description   Final    URINE, CLEAN CATCH Performed at [facility], [address] [place], [place] [address]    Special Requests   Final    NONE Performed at [facility], [address] [place], [place] [address]    Culture MULTIPLE SPECIES PRESENT, SUGGEST RECOLLECTION (A)  Final   Report Status 01/26/2022 FINAL  Final  Blood culture (routine x 2)     Status: None   Collection Time: 01/24/22  8:22 PM   Specimen: BLOOD  Result Value Ref Range Status   Specimen Description BLOOD RIGHT ASSIST CONTROL  Final   Special Requests   Final    BOTTLES DRAWN AEROBIC AND ANAEROBIC Blood Culture results may not be optimal due to an excessive volume of blood received in culture bottles   Culture   Final    NO GROWTH 5 DAYS Performed at [facility], [address], [place], [place] [address]    Report Status 01/29/2022 FINAL  Final  Blood culture (routine x 2)     Status: Abnormal   Collection Time: 01/24/22  8:36 PM   Specimen: BLOOD  Result Value Ref Range Status   Specimen Description   Final    BLOOD LEFT ASSIST CONTROL Performed at [facility], [address], [place], [place] [address]    Special Requests   Final    BOTTLES DRAWN AEROBIC AND ANAEROBIC Blood Culture adequate volume Performed at [facility], [address], [place], [place] [address]    Culture  Setup Time   Final    GRAM NEGATIVE  RODS ANAEROBIC BOTTLE ONLY Organism ID to follow Performed at [facility], [address] [place], [place] [address]    Culture (A)  Final    ESCHERICHIA COLI Confirmed Extended Spectrum Beta-Lactamase Producer (ESBL).  In bloodstream infections from ESBL  organisms, carbapenems are preferred over piperacillin/tazobactam. They are shown to have a lower risk of mortality.    Report Status 01/27/2022 FINAL  Final   Organism ID, Bacteria ESCHERICHIA COLI  Final      Susceptibility   Escherichia coli - MIC*    AMPICILLIN >=32 RESISTANT Resistant     CEFAZOLIN >=64 RESISTANT Resistant     CEFEPIME 4 INTERMEDIATE Intermediate     CEFTAZIDIME RESISTANT Resistant     CEFTRIAXONE >=64 RESISTANT Resistant     CIPROFLOXACIN >=4 RESISTANT Resistant     GENTAMICIN <=1 SENSITIVE Sensitive     IMIPENEM <=0.25 SENSITIVE Sensitive     TRIMETH/SULFA >=320 RESISTANT Resistant     AMPICILLIN/SULBACTAM 4 SENSITIVE Sensitive     PIP/TAZO <=4 SENSITIVE Sensitive     * ESCHERICHIA COLI  Blood Culture ID Panel (Reflexed)     Status: Abnormal   Collection Time: 01/24/22  8:36 PM  Result Value Ref Range Status   Enterococcus faecalis NOT DETECTED NOT DETECTED Final   Enterococcus Faecium NOT DETECTED NOT DETECTED Final   Listeria monocytogenes NOT DETECTED NOT DETECTED Final   Staphylococcus species NOT DETECTED NOT DETECTED Final   Staphylococcus aureus (BCID) NOT DETECTED NOT DETECTED Final   Staphylococcus epidermidis NOT DETECTED NOT DETECTED Final   Staphylococcus lugdunensis NOT DETECTED NOT DETECTED Final   Streptococcus species NOT DETECTED NOT DETECTED Final   Streptococcus agalactiae NOT DETECTED NOT DETECTED Final   Streptococcus pneumoniae NOT DETECTED NOT DETECTED Final   Streptococcus pyogenes NOT DETECTED NOT DETECTED Final   A.calcoaceticus-baumannii NOT DETECTED NOT DETECTED Final   Bacteroides fragilis NOT DETECTED NOT DETECTED Final   Enterobacterales DETECTED (A) NOT  DETECTED Final    Comment: Enterobacterales represent a large order of gram negative bacteria, not a single organism. CRITICAL RESULT CALLED TO, READ BACK BY AND VERIFIED WITH: KRISTIN MERRILL [id number] 01/25/22 HNM    Enterobacter cloacae complex NOT DETECTED NOT DETECTED Final   Escherichia coli DETECTED (A) NOT DETECTED Final    Comment: CRITICAL RESULT CALLED TO, READ BACK BY AND VERIFIED WITH: KRISTIN MERRILL PHARMD [id number] 01/25/22 HNM    Klebsiella aerogenes NOT DETECTED NOT DETECTED Final   Klebsiella oxytoca NOT DETECTED NOT DETECTED Final   Klebsiella pneumoniae NOT DETECTED NOT DETECTED Final   Proteus species NOT DETECTED NOT DETECTED Final   Salmonella species NOT DETECTED NOT DETECTED Final   Serratia marcescens NOT DETECTED NOT DETECTED Final   Haemophilus influenzae NOT DETECTED NOT DETECTED Final   Neisseria meningitidis NOT DETECTED NOT DETECTED Final   Pseudomonas aeruginosa NOT DETECTED NOT DETECTED Final   Stenotrophomonas maltophilia NOT DETECTED NOT DETECTED Final   Candida albicans NOT DETECTED NOT DETECTED Final   Candida auris NOT DETECTED NOT DETECTED Final   Candida glabrata NOT DETECTED NOT DETECTED Final   Candida krusei NOT DETECTED NOT DETECTED Final   Candida parapsilosis NOT DETECTED NOT DETECTED Final   Candida tropicalis NOT DETECTED NOT DETECTED Final   Cryptococcus neoformans/gattii NOT DETECTED NOT DETECTED Final   CTX-M ESBL DETECTED (A) NOT DETECTED Final    Comment: CRITICAL RESULT CALLED TO, READ BACK BY AND VERIFIED WITH: KRISTIN MERRILL [id number] 01/25/22 HNM (NOTE) Extended spectrum beta-lactamase detected. Recommend a carbapenem as initial therapy.      Carbapenem resistance IMP NOT DETECTED NOT DETECTED Final   Carbapenem resistance KPC NOT DETECTED NOT DETECTED Final   Carbapenem resistance NDM NOT DETECTED NOT DETECTED Final   Carbapenem resist OXA 48 LIKE NOT DETECTED  NOT DETECTED Final   Carbapenem resistance VIM NOT DETECTED NOT  DETECTED Final    Comment: Performed at [facility], [address] [place], [place] [address]  Culture, blood (Routine X 2) w Reflex to ID Panel     Status: None (Preliminary result)   Collection Time: 01/26/22  4:48 AM   Specimen: BLOOD RIGHT ARM  Result Value Ref Range Status   Specimen Description BLOOD RIGHT ARM  Final   Special Requests   Final    BOTTLES DRAWN AEROBIC AND ANAEROBIC Blood Culture results may not be optimal due to an inadequate volume of blood received in culture bottles   Culture   Final    NO GROWTH 3 DAYS Performed at [facility], [address] [place], [place] [address]    Report Status PENDING  Incomplete  Culture, blood (Routine X 2) w Reflex to ID Panel     Status: None (Preliminary result)   Collection Time: 01/26/22  4:48 AM   Specimen: BLOOD LEFT HAND  Result Value Ref Range Status   Specimen Description BLOOD LEFT HAND  Final   Special Requests   Final    BOTTLES DRAWN AEROBIC AND ANAEROBIC Blood Culture results may not be optimal due to an inadequate volume of blood received in culture bottles   Culture   Final    NO GROWTH 3 DAYS Performed at [facility], [address] [place], [place] [address]    Report Status PENDING  Incomplete     Labs: CBC: Recent Labs  Lab 01/24/22 1826 01/25/22 0515 01/26/22 0448 01/27/22 0340 01/28/22 0629  WBC 11.0* 20.1* 9.2 5.9 10.8*  NEUTROABS 10.6*  --   --   --   --   HGB 11.5* 9.0* 8.6* 8.3* 9.4*  HCT 35.0* 27.3* 26.5* 25.7* 28.3*  MCV 96.7 96.1 98.9 95.5 95.6  PLT 173 152 127* 130* 131*   Basic Metabolic Panel: Recent Labs  Lab 01/24/22 1826 01/25/22 0515 01/26/22 0448 01/27/22 0340 01/28/22 0629  NA 137 139 142 143 140  K 4.2 4.6 4.2 3.8 4.3  CL 105 113* 117* 113* 111  CO2 19* 21* 17* 22 22  GLUCOSE 144* 146* 89 90 138*  BUN 28* 25* 27* 18 17  CREATININE 1.36* 1.17* 1.34* 1.10* 1.09*  CALCIUM 9.2 8.0* 7.7* 7.9* 8.3*  MG  --   --  1.7 1.6* 2.0  PHOS   --   --  2.4* 3.6 3.0   Liver Function Tests: Recent Labs  Lab 01/24/22 1826 01/25/22 0515 01/26/22 0448 01/27/22 0340 01/28/22 0629  AST 624* 290* 108* 58* 45*  ALT 239* 207* 133* 91* 71*  ALKPHOS 80 59 66 73 71  BILITOT 1.8* 2.2* 1.5* 0.9 1.1  PROT 7.5 6.1* 5.8* 5.6* 6.2*  ALBUMIN 3.9 2.9* 2.8* 2.7* 2.9*   Recent Labs  Lab 01/24/22 1826 01/26/22 0448 01/27/22 0340 01/28/22 0629  LIPASE 65* 34 39 34   No results for input(s): "AMMONIA" in the last 168 hours. Cardiac Enzymes: No results for input(s): "CKTOTAL", "CKMB", "CKMBINDEX", "TROPONINI" in the last 168 hours. BNP (last 3 results) No results for input(s): "BNP" in the last 8760 hours. CBG: Recent Labs  Lab 01/27/22 0824 01/27/22 1640 01/28/22 0037 01/28/22 0858 01/28/22 1200  GLUCAP 89 121* 151* 112* 117*    Time spent: 35 minutes  Signed:  Gillis Santa  [facility] 01/28/2022  4:27 PM

## 2022-01-31 LAB — CULTURE, BLOOD (ROUTINE X 2)
Culture: NO GROWTH
Culture: NO GROWTH

## 2022-02-02 ENCOUNTER — Encounter: Payer: Self-pay | Admitting: Gastroenterology

## 2022-02-08 ENCOUNTER — Encounter: Payer: Self-pay | Admitting: Gastroenterology

## 2022-02-10 ENCOUNTER — Ambulatory Visit (INDEPENDENT_AMBULATORY_CARE_PROVIDER_SITE_OTHER): Payer: Medicare Other | Admitting: Physician Assistant

## 2022-02-10 ENCOUNTER — Encounter: Payer: Self-pay | Admitting: Physician Assistant

## 2022-02-10 VITALS — BP 148/72 | HR 105 | Temp 97.8°F | Ht 65.0 in | Wt 158.8 lb

## 2022-02-10 DIAGNOSIS — Z09 Encounter for follow-up examination after completed treatment for conditions other than malignant neoplasm: Secondary | ICD-10-CM

## 2022-02-10 DIAGNOSIS — K8050 Calculus of bile duct without cholangitis or cholecystitis without obstruction: Secondary | ICD-10-CM

## 2022-02-10 DIAGNOSIS — K8064 Calculus of gallbladder and bile duct with chronic cholecystitis without obstruction: Secondary | ICD-10-CM

## 2022-02-10 NOTE — Progress Notes (Signed)
[facility] POST-OP OFFICE VISIT  02/10/2022  HPI: Ana Peterson is a 77 y.o. female 14 days s/p robotic assisted laparoscopic cholecystectomy for choledocholithiasis and CCC with Dr Christian Mate   She is doing well No abdominal pain, nausea, emesis, or bowel changes She is tolerating PO; decreased appetite Incisions are healing well Ambulating well No other complaints   Vital signs: BP (!) 148/72   Pulse (!) 105   Temp 97.8 F (36.6 C) (Oral)   Ht 5\' 5"  (1.651 m)   Wt 158 lb 12.8 oz (72 kg)   SpO2 98%   BMI 26.43 kg/m    Physical Exam: Constitutional: Well appearing female, NAD Abdomen: Soft, non-tender, non-distended, no rebound/guarding Skin: Laparoscopic incisions are healing well, no erythema or drainage   Assessment/Plan: This is a 77 y.o. female 14 days s/p robotic assisted laparoscopic cholecystectomy for choledocholithiasis and CCC with Dr Christian Mate    - Pain control prn  - Reviewed wound care recommendation  - Reviewed lifting restrictions; 4 weeks total  - Reviewed surgical pathology; [place]  - She can follow up on as needed basis; She understands to call with questions/concerns  Please note, daughter at bedside present to help translate.   -- Edison Simon, PA-C [facility] 02/10/2022, 2:08 PM M-F: 7am - 4pm

## 2022-02-10 NOTE — Patient Instructions (Signed)

## 2022-05-13 ENCOUNTER — Other Ambulatory Visit: Payer: Self-pay | Admitting: Primary Care

## 2022-05-13 DIAGNOSIS — R1084 Generalized abdominal pain: Secondary | ICD-10-CM

## 2022-05-13 DIAGNOSIS — R140 Abdominal distension (gaseous): Secondary | ICD-10-CM

## 2022-05-17 ENCOUNTER — Ambulatory Visit
Admission: RE | Admit: 2022-05-17 | Discharge: 2022-05-17 | Disposition: A | Discharge status: Home / Self Care | Payer: Medicare Other | Source: Ambulatory Visit | Attending: Primary Care | Admitting: Primary Care

## 2022-05-17 DIAGNOSIS — R140 Abdominal distension (gaseous): Secondary | ICD-10-CM | POA: Diagnosis present

## 2022-05-17 DIAGNOSIS — R1084 Generalized abdominal pain: Secondary | ICD-10-CM | POA: Diagnosis present
